# Patient Record
Sex: Male | Born: 1955
Health system: Southern US, Community
[De-identification: ages and names within clinical notes are randomized; demographics above are authoritative.]

## PROBLEM LIST (undated history)

## (undated) DIAGNOSIS — H3320 Serous retinal detachment, unspecified eye: Secondary | ICD-10-CM

## (undated) DIAGNOSIS — I10 Essential (primary) hypertension: Secondary | ICD-10-CM

## (undated) HISTORY — PX: KNEE SURGERY: SHX244

---

## 2010-12-19 ENCOUNTER — Emergency Department (HOSPITAL_BASED_OUTPATIENT_CLINIC_OR_DEPARTMENT_OTHER)
Admission: EM | Admit: 2010-12-19 | Discharge: 2010-12-19 | Payer: Self-pay | Source: Home / Self Care | Admitting: Emergency Medicine

## 2011-11-19 ENCOUNTER — Encounter: Payer: Self-pay | Admitting: *Deleted

## 2011-11-19 ENCOUNTER — Emergency Department (HOSPITAL_BASED_OUTPATIENT_CLINIC_OR_DEPARTMENT_OTHER)
Admission: EM | Admit: 2011-11-19 | Discharge: 2011-11-19 | Disposition: A | Discharge status: Home / Self Care | Payer: Federal, State, Local not specified - PPO | Attending: Emergency Medicine | Admitting: Emergency Medicine

## 2011-11-19 ENCOUNTER — Emergency Department (INDEPENDENT_AMBULATORY_CARE_PROVIDER_SITE_OTHER): Payer: Federal, State, Local not specified - PPO

## 2011-11-19 ENCOUNTER — Other Ambulatory Visit: Payer: Self-pay

## 2011-11-19 DIAGNOSIS — K219 Gastro-esophageal reflux disease without esophagitis: Secondary | ICD-10-CM

## 2011-11-19 DIAGNOSIS — I10 Essential (primary) hypertension: Secondary | ICD-10-CM | POA: Insufficient documentation

## 2011-11-19 DIAGNOSIS — R079 Chest pain, unspecified: Secondary | ICD-10-CM

## 2011-11-19 HISTORY — DX: Serous retinal detachment, unspecified eye: H33.20

## 2011-11-19 HISTORY — DX: Essential (primary) hypertension: I10

## 2011-11-19 LAB — CARDIAC PANEL(CRET KIN+CKTOT+MB+TROPI)
CK, MB: 3.3 ng/mL (ref 0.3–4.0)
Relative Index: 0.7 (ref 0.0–2.5)

## 2011-11-19 LAB — BASIC METABOLIC PANEL
CO2: 25 mEq/L (ref 19–32)
Calcium: 9.4 mg/dL (ref 8.4–10.5)
Chloride: 104 mEq/L (ref 96–112)
Creatinine, Ser: 1 mg/dL (ref 0.50–1.35)
Glucose, Bld: 95 mg/dL (ref 70–99)

## 2011-11-19 MED ORDER — FAMOTIDINE 20 MG PO TABS
40.0000 mg | ORAL_TABLET | Freq: Once | ORAL | Status: AC
  Administered 2011-11-19: 40 mg via ORAL
  Filled 2011-11-19: qty 2

## 2011-11-19 MED ORDER — GI COCKTAIL ~~LOC~~
30.0000 mL | Freq: Once | ORAL | Status: AC
  Administered 2011-11-19: 30 mL via ORAL
  Filled 2011-11-19: qty 30

## 2011-11-19 MED ORDER — SUCRALFATE 1 G PO TABS
ORAL_TABLET | ORAL | Status: AC
  Filled 2011-11-19: qty 1

## 2011-11-19 MED ORDER — PANTOPRAZOLE SODIUM 20 MG PO TBEC: 40.0000 mg | DELAYED_RELEASE_TABLET | Freq: Every day | ORAL | Status: AC

## 2011-11-19 MED ORDER — SUCRALFATE 1 G PO TABS
1.0000 g | ORAL_TABLET | Freq: Three times a day (TID) | ORAL | Status: DC
  Administered 2011-11-19: 1 g via ORAL

## 2011-11-19 MED ORDER — SUCRALFATE 1 G PO TABS: 1.0000 g | ORAL_TABLET | Freq: Four times a day (QID) | ORAL | Status: DC

## 2011-11-19 NOTE — ED Provider Notes (Signed)
History    This chart was scribed for Kyle Baker, MD, MD by Smitty Pluck. The patient was seen in room MH10 and the patient's care was started at 7:39PM.   CSN: [id number] Arrival date & time: 11/19/2011  7:15 PM   First MD Initiated Contact with Patient 11/19/11 1926      Chief Complaint  Patient presents with  . Chest Pain    (Consider location/radiation/quality/duration/timing/severity/associated sxs/prior treatment) Patient is a 55 y.o. male presenting with chest pain. The history is provided by the patient.  Chest Pain    Kyle Chaney is a 55 y.o. male who presents to the Emergency Department complaining of persistent moderate chest pain that radiates into shoulders and neck onset today. Also, he has been having frequent bowel movements that are looser than normal. Pt reports that the pain is relieved with burping. He states he has had flatulence. Denies nausea, dyspnea, exertional chest pain, diaphoresis, vomiting, constipation, and blood in stool. Took Gas X medication without relief. Pt reports drinking alcohol this weekend. His chest discomfort is described as pressure and has been constant all day. Pt reports having HTN and detached retina.  Past Medical History  Diagnosis Date  . Hypertension   . Detached retina     Past Surgical History  Procedure Date  . Knee surgery     History reviewed. No pertinent family history.  History  Substance Use Topics  . Smoking status: Never Smoker   . Smokeless tobacco: Not on file  . Alcohol Use: Yes      Review of Systems  Cardiovascular: Positive for chest pain.  All other systems reviewed and are negative.   10 Systems reviewed and are negative for acute change except as noted in the HPI.  Allergies  Review of patient's allergies indicates no known allergies.  Home Medications   Current Outpatient Rx  Name Route Sig Dispense Refill  . LISINOPRIL 20 MG PO TABS Oral Take 20 mg by mouth daily.        BP  161/91  Pulse 92  Temp(Src) 98.6 F (37 C) (Oral)  Resp 20  Ht 6' (1.829 m)  Wt 185 lb (83.915 kg)  BMI 25.09 kg/m2  SpO2 98%  Physical Exam  Nursing note and vitals reviewed. Constitutional: He is oriented to person, place, and time. He appears well-developed and well-nourished. No distress.  HENT:  Head: Normocephalic and atraumatic.  Eyes: EOM are normal. Pupils are equal, round, and reactive to light.  Neck: Normal range of motion. Neck supple. No tracheal deviation present.  Cardiovascular: Normal rate, regular rhythm and normal heart sounds.   Pulmonary/Chest: Effort normal and breath sounds normal. No respiratory distress.  Abdominal: Soft. Bowel sounds are normal. He exhibits no distension. There is no tenderness.  Musculoskeletal: Normal range of motion.  Neurological: He is alert and oriented to person, place, and time.  Skin: Skin is warm and dry.  Psychiatric: He has a normal mood and affect. His behavior is normal.    ED Course  Procedures (including critical care time)  DIAGNOSTIC STUDIES: Oxygen Saturation is 98% on room air, normal by my interpretation.    COORDINATION OF CARE:    Labs Reviewed  BASIC METABOLIC PANEL - Abnormal; Notable for the following:    GFR calc non Af Amer 83 (*)    All other components within normal limits  CARDIAC PANEL(CRET KIN+CKTOT+MB+TROPI) - Abnormal; Notable for the following:    Total CK 463 (*)    All  other components within normal limits   Dg Chest 2 View  11/19/2011  *RADIOLOGY REPORT*  Clinical Data: Chest pain.  CHEST - 2 VIEW  Comparison: Chest x-ray 12/19/2010.  Findings: The cardiac silhouette, mediastinal and hilar contours are within normal limits and stable. The lungs are clear.  No pleural effusions.  The bony thorax is intact.  IMPRESSION: Normal chest x-ray.  No change since prior study.  Original Report Authenticated By: P. Loralie Champagne, M.D.     No diagnosis found.    MDM   Date: 11/19/2011   Rate: 85  Rhythm: normal sinus rhythm  QRS Axis: left  Intervals: normal  ST/T Wave abnormalities: nonspecific ST/T changes  Conduction Disutrbances:none  Narrative Interpretation:   Old EKG Reviewed: none available        I personally performed the services described in this documentation, which was scribed in my presence. The recorded information has been reviewed and considered.  8:54 PM Pt given meds for gerd and feels better, no concern for acs ( cardiac enzymes neg)--will d/c  Kyle Baker, MD 11/19/11 2055

## 2011-11-19 NOTE — ED Notes (Signed)
Pt states he has had chest tightness today. "Burping a lot, passing more gas than usual and having looser than normal stools" Radiates up into shoulders and neck. Denies other s/s.

## 2017-09-01 ENCOUNTER — Emergency Department (HOSPITAL_BASED_OUTPATIENT_CLINIC_OR_DEPARTMENT_OTHER)
Admission: EM | Admit: 2017-09-01 | Discharge: 2017-09-01 | Disposition: A | Discharge status: Home / Self Care | Payer: Federal, State, Local not specified - PPO | Attending: Emergency Medicine | Admitting: Emergency Medicine

## 2017-09-01 ENCOUNTER — Encounter (HOSPITAL_BASED_OUTPATIENT_CLINIC_OR_DEPARTMENT_OTHER): Payer: Self-pay | Admitting: Emergency Medicine

## 2017-09-01 ENCOUNTER — Emergency Department (HOSPITAL_BASED_OUTPATIENT_CLINIC_OR_DEPARTMENT_OTHER): Payer: Federal, State, Local not specified - PPO

## 2017-09-01 DIAGNOSIS — Y999 Unspecified external cause status: Secondary | ICD-10-CM | POA: Diagnosis not present

## 2017-09-01 DIAGNOSIS — I10 Essential (primary) hypertension: Secondary | ICD-10-CM | POA: Diagnosis not present

## 2017-09-01 DIAGNOSIS — Z79899 Other long term (current) drug therapy: Secondary | ICD-10-CM | POA: Diagnosis not present

## 2017-09-01 DIAGNOSIS — S61215A Laceration without foreign body of left ring finger without damage to nail, initial encounter: Secondary | ICD-10-CM | POA: Insufficient documentation

## 2017-09-01 DIAGNOSIS — S6982XA Other specified injuries of left wrist, hand and finger(s), initial encounter: Secondary | ICD-10-CM | POA: Diagnosis present

## 2017-09-01 DIAGNOSIS — Z23 Encounter for immunization: Secondary | ICD-10-CM | POA: Diagnosis not present

## 2017-09-01 DIAGNOSIS — Y939 Activity, unspecified: Secondary | ICD-10-CM | POA: Insufficient documentation

## 2017-09-01 DIAGNOSIS — Y929 Unspecified place or not applicable: Secondary | ICD-10-CM | POA: Insufficient documentation

## 2017-09-01 DIAGNOSIS — W268XXA Contact with other sharp object(s), not elsewhere classified, initial encounter: Secondary | ICD-10-CM | POA: Diagnosis not present

## 2017-09-01 MED ORDER — AMOXICILLIN-POT CLAVULANATE 875-125 MG PO TABS: 1.0000 | ORAL_TABLET | Freq: Two times a day (BID) | ORAL | 0 refills | Status: DC

## 2017-09-01 MED ORDER — LIDOCAINE HCL 2 % IJ SOLN
INTRAMUSCULAR | Status: AC
  Administered 2017-09-01: 400 mg
  Filled 2017-09-01: qty 20

## 2017-09-01 MED ORDER — IBUPROFEN 800 MG PO TABS: 800.0000 mg | ORAL_TABLET | Freq: Three times a day (TID) | ORAL | 0 refills | Status: AC

## 2017-09-01 MED ORDER — LIDOCAINE HCL (PF) 1 % IJ SOLN: 10.0000 mL | Freq: Once | INTRAMUSCULAR | Status: DC

## 2017-09-01 MED ORDER — TETANUS-DIPHTH-ACELL PERTUSSIS 5-2.5-18.5 LF-MCG/0.5 IM SUSP
0.5000 mL | Freq: Once | INTRAMUSCULAR | Status: AC
  Administered 2017-09-01: 0.5 mL via INTRAMUSCULAR
  Filled 2017-09-01: qty 0.5

## 2017-09-01 NOTE — Discharge Instructions (Signed)
Keep dressing on for 24 hours. After this, you may remove dressing, wash gently with soap and water, and reapply dressing. Change dressing daily. Take antibiotics as prescribed. Take ibuprofen 800 mg 3 times a day with meals. Do not take other antibiotics at the same time (Advil, Motrin, naproxen, Aleve). You may supplement as needed with Tylenol. Follow-up in 7 - 10 days for suture removal. Return earlier if you develop fever, chills, purulent drainage, inability to move your finger, or any new or worsening symptoms.

## 2017-09-01 NOTE — ED Notes (Signed)
ED Provider at bedside. 

## 2017-09-01 NOTE — ED Notes (Signed)
Assumed care of patient from [place], [place]. Pt resting quietly. No distress. PA at bedside. Denies pain at present.

## 2017-09-01 NOTE — ED Provider Notes (Signed)
[facility] Provider [facility]   CSN: [id number] Arrival date & time: 09/01/17  1142     History   Chief Complaint Chief Complaint  Patient presents with  . Laceration    HPI Kyle Chaney is a 61 y.o. male presenting with injury of the left ring finger.  Patient states he was wearing gloves, lifting a heavy metal cage. He felt something abnormal with his finger, took off his glove, and the pad of his ring finger was avulsed. There was no damage to the glove. He has no injury to other fingers. He denies numbness or tingling. He has full range of motion of the finger. He reports pain at the site of avulsion, but no pain elsewhere. He has not taken anything for pain. He does not know when his last tetanus shot was. He is not on blood thinners. He is not immunocompromised. He has a history of high blood pressure, for which he takes medication. He has not taken his medication today, and states he was very anxious due to his injury. He denies headache, slurred speech, vision changes, weakness, or numbness.  HPI  Past Medical History:  Diagnosis Date  . Detached retina   . Hypertension     There are no active problems to display for this patient.   Past Surgical History:  Procedure Laterality Date  . KNEE SURGERY         Home Medications    Prior to Admission medications   Medication Sig Start Date End Date Taking? Authorizing Provider  amoxicillin-clavulanate (AUGMENTIN) 875-125 MG tablet Take 1 tablet by mouth every 12 (twelve) hours. 09/01/17   Jad Johansson, PA-C  ibuprofen (ADVIL,MOTRIN) 800 MG tablet Take 1 tablet (800 mg total) by mouth 3 (three) times daily with meals. 09/01/17   Cynitha Berte, PA-C  lisinopril (PRINIVIL,ZESTRIL) 20 MG tablet Take 20 mg by mouth daily.      [provider]  pantoprazole (PROTONIX) 20 MG tablet Take 2 tablets (40 mg total) by mouth daily. 11/19/11 11/18/12  Lorre Nick, MD  simethicone (MYLICON) 125 MG  chewable tablet Chew 125 mg by mouth 2 (two) times daily as needed. For gas     [provider]  sucralfate (CARAFATE) 1 G tablet Take 1 tablet (1 g total) by mouth 4 (four) times daily. 11/19/11 11/18/12  Lorre Nick, MD    Family History No family history on file.  Social History Social History  Substance Use Topics  . Smoking status: Never Smoker  . Smokeless tobacco: Never Used  . Alcohol use Yes     Allergies   Patient has no known allergies.   Review of Systems Review of Systems  Skin: Positive for wound.  Allergic/Immunologic: Negative for immunocompromised state.  Neurological: Negative for numbness.  Hematological: Does not bruise/bleed easily.     Physical Exam Updated Vital Signs BP (!) 197/118 (BP Location: Right Arm) Comment: RN aware. Pt states is going to PCP in a week for BP  Pulse 76   Temp 98.5 F (36.9 C) (Oral)   Resp 20   Ht 6' (1.829 m)   Wt 81.6 kg (180 lb)   SpO2 97%   BMI 24.41 kg/m   Physical Exam  Constitutional: He is oriented to person, place, and time. He appears well-developed and well-nourished. No distress.  HENT:  Head: Normocephalic and atraumatic.  Eyes: EOM are normal.  Neck: Normal range of motion.  Pulmonary/Chest: Effort normal.  Abdominal: He exhibits no distension.  Musculoskeletal:  Normal range of motion.  Full active range of motion of all fingers. Sensation of the ring finger intact. Strength against resistance intact.  Neurological: He is alert and oriented to person, place, and time.  Skin: Skin is warm. No rash noted.  Avulsion of the pad of left ring finger. Minimal bleeding. No involvement of the nail. No injury elsewhere.  Psychiatric: He has a normal mood and affect.  Nursing note and vitals reviewed.    ED Treatments / Results  Labs (all labs ordered are listed, but only abnormal results are displayed) Labs Reviewed - No data to display  EKG  EKG Interpretation None        Radiology Dg Finger Ring Left  Result Date: 09/01/2017 CLINICAL DATA:  61 year old male with a history of crush injury EXAM: LEFT RING FINGER 2+V COMPARISON:  None. FINDINGS: Soft tissue defect of the volar soft tissues of the distal left fourth digit. No fracture line identified. Joints appear congruent. Minimal degenerative changes. No radiopaque foreign body. IMPRESSION: Negative for acute bony abnormality. Soft tissue defect on the volar soft tissues of the distal fourth digit, compatible with given history of trauma. Electronically Signed   By: Gilmer Mor D.O.   On: 09/01/2017 12:46    Procedures .Marland KitchenLaceration Repair Date/Time: 09/01/2017 2:09 PM Performed by: Alveria Apley Authorized by: Alveria Apley   Consent:    Consent obtained:  Verbal   Consent given by:  Patient   Risks discussed:  Infection, pain, poor cosmetic result, poor wound healing, vascular damage, tendon damage and nerve damage Anesthesia (see MAR for exact dosages):    Anesthesia method:  Local infiltration   Local anesthetic:  Lidocaine 2% w/o epi Laceration details:    Location:  Finger   Finger location:  L ring finger   Length (cm):  1.5 (Circular)   Depth (mm):  3 Repair type:    Repair type:  Simple Pre-procedure details:    Preparation:  Patient was prepped and draped in usual sterile fashion Exploration:    Hemostasis achieved with:  Direct pressure   Wound exploration: wound explored through full range of motion and entire depth of wound probed and visualized     Wound exploration comment:  Laceration explored to its full extent in a nonbloody field Treatment:    Area cleansed with:  Saline and Betadine   Amount of cleaning:  Extensive   Irrigation solution:  Sterile saline   Irrigation volume:  500   Irrigation method:  Syringe Skin repair:    Repair method:  Sutures   Suture size:  5-0   Suture material:  Prolene   Suture technique:  Simple interrupted   Number of sutures:   7 Approximation:    Approximation:  Close Post-procedure details:    Dressing:  Bulky dressing   Patient tolerance of procedure:  Tolerated well, no immediate complications    (including critical care time)  Medications Ordered in ED Medications  lidocaine (PF) (XYLOCAINE) 1 % injection 10 mL (not administered)  Tdap (BOOSTRIX) injection 0.5 mL (0.5 mLs Intramuscular Given 09/01/17 1315)  lidocaine (XYLOCAINE) 2 % (with pres) injection (400 mg  Given 09/01/17 1320)     Initial Impression / Assessment and Plan / ED Course  I have reviewed the triage vital signs and the nursing notes.  Pertinent labs & imaging results that were available during my care of the patient were reviewed by me and considered in my medical decision making (see chart for details).  Patient presenting with avulsion of the pad of his left ring finger. Neurovascularly intact. No apparent tendon or nerve damage. X-ray negative for fracture. Digital block performed, and wound was irrigated extensively. Sutures place, and aftercare instructions given. Abx given to prevent infection. Patient was able to move finger with full range of motion after suturing. Patient to follow-up in 7-10 days for suture removal. Return precautions given. Patient states he understands and agrees to plan.  Final Clinical Impressions(s) / ED Diagnoses   Final diagnoses:  Laceration of left ring finger without foreign body without damage to nail, initial encounter    New Prescriptions Discharge Medication List as of 09/01/2017  2:07 PM    START taking these medications   Details  amoxicillin-clavulanate (AUGMENTIN) 875-125 MG tablet Take 1 tablet by mouth every 12 (twelve) hours., Starting Sat 09/01/2017, Print         [place], [place], PA-C 09/01/17 1646    Rolan Bucco, MD 09/02/17 [phone]

## 2017-09-01 NOTE — ED Triage Notes (Signed)
Laceration to L ring finger from a metal cage.

## 2017-12-03 ENCOUNTER — Emergency Department (HOSPITAL_BASED_OUTPATIENT_CLINIC_OR_DEPARTMENT_OTHER)
Admission: EM | Admit: 2017-12-03 | Discharge: 2017-12-03 | Disposition: A | Discharge status: Home / Self Care | Payer: Federal, State, Local not specified - PPO | Attending: Emergency Medicine | Admitting: Emergency Medicine

## 2017-12-03 ENCOUNTER — Encounter (HOSPITAL_BASED_OUTPATIENT_CLINIC_OR_DEPARTMENT_OTHER): Payer: Self-pay | Admitting: *Deleted

## 2017-12-03 ENCOUNTER — Other Ambulatory Visit: Payer: Self-pay

## 2017-12-03 ENCOUNTER — Emergency Department (HOSPITAL_BASED_OUTPATIENT_CLINIC_OR_DEPARTMENT_OTHER): Payer: Federal, State, Local not specified - PPO

## 2017-12-03 DIAGNOSIS — F17200 Nicotine dependence, unspecified, uncomplicated: Secondary | ICD-10-CM | POA: Diagnosis not present

## 2017-12-03 DIAGNOSIS — I10 Essential (primary) hypertension: Secondary | ICD-10-CM | POA: Diagnosis not present

## 2017-12-03 DIAGNOSIS — Z79899 Other long term (current) drug therapy: Secondary | ICD-10-CM | POA: Diagnosis not present

## 2017-12-03 DIAGNOSIS — R05 Cough: Secondary | ICD-10-CM | POA: Diagnosis present

## 2017-12-03 DIAGNOSIS — J189 Pneumonia, unspecified organism: Secondary | ICD-10-CM | POA: Insufficient documentation

## 2017-12-03 LAB — BASIC METABOLIC PANEL
Anion gap: 6 (ref 5–15)
BUN: 14 mg/dL (ref 6–20)
CO2: 24 mmol/L (ref 22–32)
Calcium: 9.2 mg/dL (ref 8.9–10.3)
Chloride: 104 mmol/L (ref 101–111)
Creatinine, Ser: 1.03 mg/dL (ref 0.61–1.24)
GFR calc Af Amer: >60 mL/min (ref >60)
GFR calc non Af Amer: >60 mL/min (ref >60)
Glucose, Bld: 89 mg/dL (ref 65–99)
Potassium: 4 mmol/L (ref 3.5–5.1)
Sodium: 134 mmol/L — ABNORMAL LOW (ref 135–145)

## 2017-12-03 LAB — CBC WITH DIFFERENTIAL/PLATELET
Basophils Absolute: 0 10*3/uL (ref 0.0–0.1)
Basophils Relative: 1 %
Eosinophils Absolute: 0.2 10*3/uL (ref 0.0–0.7)
Eosinophils Relative: 3 %
HCT: 41.5 % (ref 39.0–52.0)
Hemoglobin: 14 g/dL (ref 13.0–17.0)
Lymphocytes Relative: 34 %
Lymphs Abs: 2.1 10*3/uL (ref 0.7–4.0)
MCH: 32.7 pg (ref 26.0–34.0)
MCHC: 33.7 g/dL (ref 30.0–36.0)
MCV: 97 fL (ref 78.0–100.0)
Monocytes Absolute: 0.7 10*3/uL (ref 0.1–1.0)
Monocytes Relative: 11 %
Neutro Abs: 3.3 10*3/uL (ref 1.7–7.7)
Neutrophils Relative %: 53 %
Platelets: 243 10*3/uL (ref 150–400)
RBC: 4.28 MIL/uL (ref 4.22–5.81)
RDW: 13 % (ref 11.5–15.5)
WBC: 6.2 10*3/uL (ref 4.0–10.5)

## 2017-12-03 LAB — TROPONIN I: Troponin I: <0.03 ng/mL (ref <0.03)

## 2017-12-03 MED ORDER — ALBUTEROL SULFATE HFA 108 (90 BASE) MCG/ACT IN AERS
2.0000 | INHALATION_SPRAY | Freq: Once | RESPIRATORY_TRACT | Status: AC
Start: 2017-12-03 — End: 2017-12-03
  Administered 2017-12-03: 2 via RESPIRATORY_TRACT
  Filled 2017-12-03: qty 6.7

## 2017-12-03 MED ORDER — AZITHROMYCIN 250 MG PO TABS
500.0000 mg | ORAL_TABLET | Freq: Once | ORAL | Status: AC
  Administered 2017-12-03: 500 mg via ORAL
  Filled 2017-12-03: qty 2

## 2017-12-03 MED ORDER — AZITHROMYCIN 250 MG PO TABS: 250.0000 mg | ORAL_TABLET | Freq: Every day | ORAL | 0 refills | Status: DC

## 2017-12-03 MED ORDER — CEFTRIAXONE SODIUM 2 G IJ SOLR
2.0000 g | Freq: Once | INTRAMUSCULAR | Status: AC
  Administered 2017-12-03: 2 g via INTRAVENOUS
  Filled 2017-12-03: qty 2

## 2017-12-03 MED ORDER — AMOXICILLIN 500 MG PO CAPS: 500.0000 mg | ORAL_CAPSULE | Freq: Three times a day (TID) | ORAL | 0 refills | Status: DC

## 2017-12-03 MED ORDER — AEROCHAMBER PLUS FLO-VU MEDIUM MISC
1.0000 | Freq: Once | Status: AC
  Administered 2017-12-03: 1
  Filled 2017-12-03: qty 1

## 2017-12-03 NOTE — ED Triage Notes (Signed)
Pt reports cough x 1 week, initially producing clear sputum, subjective temps, sputum changed color to dark yellow a few days ago. Pt reports constant pain "like gas" to his chest x 3 days, relieved with belching. ekg performed during triage and shown to edp.

## 2017-12-03 NOTE — ED Notes (Signed)
Pt transported to and from XR 

## 2017-12-05 NOTE — ED Provider Notes (Signed)
[facility] EMERGENCY DEPARTMENT Provider Note   CSN: [id number] Arrival date & time: 12/03/17  1111     History   Chief Complaint Chief Complaint  Patient presents with  . Cough    HPI Kyle Chaney is a 62 y.o. male.  HPI   62 year old male with cough and chest pain.  The cough began approximately 1 week ago.  Reports yellow sputum.  No hemoptysis.  The past 3 days she has had vague chest pain.  Says it feels "like gas left side of his chest.  No appreciable exacerbating relieving factors.  Denies any other associated symptoms such as diaphoresis or nausea.  No unusual leg pain or swelling.  Past Medical History:  Diagnosis Date  . Detached retina   . Hypertension     There are no active problems to display for this patient.   Past Surgical History:  Procedure Laterality Date  . KNEE SURGERY         Home Medications    Prior to Admission medications   Medication Sig Start Date End Date Taking? Authorizing Provider  amoxicillin (AMOXIL) 500 MG capsule Take 1 capsule (500 mg total) by mouth 3 (three) times daily. 12/03/17   Raeford RazorKohut, Kalaya Infantino, MD  amoxicillin-clavulanate (AUGMENTIN) 875-125 MG tablet Take 1 tablet by mouth every 12 (twelve) hours. 09/01/17   Caccavale, Sophia, PA-C  azithromycin (ZITHROMAX Z-PAK) 250 MG tablet Take 1 tablet (250 mg total) by mouth daily. First dose in ER. Start 1 tablet daily on 12/04/17 for 4 days. 12/03/17   Raeford RazorKohut, Tamaya Pun, MD  ibuprofen (ADVIL,MOTRIN) 800 MG tablet Take 1 tablet (800 mg total) by mouth 3 (three) times daily with meals. 09/01/17   Caccavale, Sophia, PA-C  lisinopril (PRINIVIL,ZESTRIL) 20 MG tablet Take 20 mg by mouth daily.      [provider]  pantoprazole (PROTONIX) 20 MG tablet Take 2 tablets (40 mg total) by mouth daily. 11/19/11 11/18/12  Lorre NickAllen, Anthony, MD  simethicone (MYLICON) 125 MG chewable tablet Chew 125 mg by mouth 2 (two) times daily as needed. For gas     [provider]    sucralfate (CARAFATE) 1 G tablet Take 1 tablet (1 g total) by mouth 4 (four) times daily. 11/19/11 11/18/12  Lorre NickAllen, Anthony, MD    Family History History reviewed. No pertinent family history.  Social History Social History   Tobacco Use  . Smoking status: Current Every Day Smoker  . Smokeless tobacco: Never Used  Substance Use Topics  . Alcohol use: Yes  . Drug use: No     Allergies   Patient has no known allergies.   Review of Systems Review of Systems  All systems reviewed and negative, other than as noted in HPI.  Physical Exam Updated Vital Signs BP (!) 170/100 (BP Location: Right Arm)   Pulse 73   Temp 99.2 F (37.3 C)   Resp 16   Ht 6' (1.829 m)   Wt 79.4 kg (175 lb)   SpO2 98%   BMI 23.73 kg/m   Physical Exam  Constitutional: He appears well-developed and well-nourished. No distress.  HENT:  Head: Normocephalic and atraumatic.  Eyes: Conjunctivae are normal. Right eye exhibits no discharge. Left eye exhibits no discharge.  Neck: Neck supple.  Cardiovascular: Normal rate, regular rhythm and normal heart sounds. Exam reveals no gallop and no friction rub.  No murmur heard. Pulmonary/Chest: Effort normal and breath sounds normal. No respiratory distress.  Abdominal: Soft. He exhibits no distension. There is no  tenderness.  Musculoskeletal: He exhibits no edema or tenderness.  Lower extremities symmetric as compared to each other. No calf tenderness. Negative Homan's. No palpable cords.   Neurological: He is alert.  Skin: Skin is warm and dry.  Psychiatric: He has a normal mood and affect. His behavior is normal. Thought content normal.  Nursing note and vitals reviewed.    ED Treatments / Results  Labs (all labs ordered are listed, but only abnormal results are displayed) Labs Reviewed  BASIC METABOLIC PANEL - Abnormal; Notable for the following components:      Result Value   Sodium 134 (*)    All other components within normal limits   TROPONIN I  CBC WITH DIFFERENTIAL/PLATELET    EKG  EKG Interpretation  Date/Time:  Monday December 03 2017 11:25:29 EST Ventricular Rate:  79 PR Interval:  138 QRS Duration: 84 QT Interval:  354 QTC Calculation: 405 R Axis:   -115 Text Interpretation:  Normal sinus rhythm Right superior axis deviation Anteroseptal infarct , age undetermined Abnormal ECG Confirmed by Raeford Razor [phone]) on 12/03/2017 11:51:28 AM       Radiology No results found.   Dg Chest 2 View  Result Date: 12/03/2017 CLINICAL DATA:  URI symptoms for the past 8 days. Two days of chest pain and shortness of breath. The patient is undergoing smoking sensation. EXAM: CHEST  2 VIEW COMPARISON:  Chest x-ray of December 19, 2016 FINDINGS: The lungs are mildly hyperinflated. There are linear increased densities projecting over the lower thoracic spine likely on the right. The heart and pulmonary vascularity are normal. The mediastinum is normal in width. There is faint calcification in the wall of the aortic arch. There is no pleural effusion. The bony thorax is unremarkable. IMPRESSION: COPD. Posterior right lower lobe atelectasis or pneumonia. Followup PA and lateral chest X-ray is recommended in 3-4 weeks following trial of antibiotic therapy to ensure resolution and exclude underlying malignancy. Thoracic aortic atherosclerosis. Electronically Signed   By: David  [place] M.D.   On: 12/03/2017 12:16    Procedures Procedures (including critical care time)  Medications Ordered in ED Medications  albuterol (PROVENTIL HFA;VENTOLIN HFA) 108 (90 Base) MCG/ACT inhaler 2 puff (2 puffs Inhalation Given 12/03/17 1238)  AEROCHAMBER PLUS FLO-VU MEDIUM MISC 1 each (1 each Other Given 12/03/17 1239)  cefTRIAXone (ROCEPHIN) 2 g in dextrose 5 % 50 mL IVPB (0 g Intravenous Stopped 12/03/17 1315)  azithromycin (ZITHROMAX) tablet 500 mg (500 mg Oral Given 12/03/17 1244)     Initial Impression / Assessment and Plan / ED Course   I have reviewed the triage vital signs and the nursing notes.  Pertinent labs & imaging results that were available during my care of the patient were reviewed by me and considered in my medical decision making (see chart for details).     62 year old male with cough and vague chest discomfort.  Chest x-ray with left-sided infiltrate.  He does not have significant increased work of breathing.  Oxygen saturations are normal on room air.  He will be treated as an outpatient for community-acquired pneumonia.  Return precautions were discussed.  Final Clinical Impressions(s) / ED Diagnoses   Final diagnoses:  Community acquired pneumonia, unspecified laterality    ED Discharge Orders        Ordered    azithromycin (ZITHROMAX Z-PAK) 250 MG tablet  Daily     12/03/17 1321    amoxicillin (AMOXIL) 500 MG capsule  3 times daily     12/03/17  1321       Raeford Razor, MD 12/05/17 1626

## 2018-03-19 ENCOUNTER — Other Ambulatory Visit: Payer: Self-pay

## 2018-03-19 ENCOUNTER — Encounter (HOSPITAL_BASED_OUTPATIENT_CLINIC_OR_DEPARTMENT_OTHER): Payer: Self-pay | Admitting: *Deleted

## 2018-03-19 ENCOUNTER — Emergency Department (HOSPITAL_BASED_OUTPATIENT_CLINIC_OR_DEPARTMENT_OTHER)
Admission: EM | Admit: 2018-03-19 | Discharge: 2018-03-19 | Disposition: A | Discharge status: Home / Self Care | Payer: Federal, State, Local not specified - PPO | Attending: Emergency Medicine | Admitting: Emergency Medicine

## 2018-03-19 DIAGNOSIS — F17200 Nicotine dependence, unspecified, uncomplicated: Secondary | ICD-10-CM | POA: Diagnosis not present

## 2018-03-19 DIAGNOSIS — J0110 Acute frontal sinusitis, unspecified: Secondary | ICD-10-CM | POA: Insufficient documentation

## 2018-03-19 DIAGNOSIS — Z79899 Other long term (current) drug therapy: Secondary | ICD-10-CM | POA: Insufficient documentation

## 2018-03-19 DIAGNOSIS — I10 Essential (primary) hypertension: Secondary | ICD-10-CM | POA: Insufficient documentation

## 2018-03-19 DIAGNOSIS — R0981 Nasal congestion: Secondary | ICD-10-CM | POA: Diagnosis present

## 2018-03-19 MED ORDER — CETIRIZINE HCL 10 MG PO TABS: 10.0000 mg | ORAL_TABLET | Freq: Every day | ORAL | 0 refills | Status: AC

## 2018-03-19 MED ORDER — FLUTICASONE PROPIONATE 50 MCG/ACT NA SUSP: 2.0000 | Freq: Every day | NASAL | 0 refills | Status: AC

## 2018-03-19 MED ORDER — AMOXICILLIN-POT CLAVULANATE 875-125 MG PO TABS: 1.0000 | ORAL_TABLET | Freq: Two times a day (BID) | ORAL | 0 refills | Status: AC

## 2018-03-19 MED FILL — FLUTICASONE PROP 50 MCG SPR: 50 | 30 days supply | Qty: 16 | Fill #0

## 2018-03-19 MED FILL — CETIRIZINE HCL 10 MG TABLET: 10 | 100 days supply | Qty: 100 | Fill #0

## 2018-03-19 MED FILL — AMOX-CLAV 875-125 MG TABLET: 875-125 | 7 days supply | Qty: 14 | Fill #0

## 2018-03-19 NOTE — Discharge Instructions (Signed)
Medications: Flonase, Zyrtec, Augmentin  Treatment: Take Flonase once daily as prescribed.  Take Zyrtec instead of Benadryl once daily.  Take Augmentin twice daily until completed.  You can find a Neti-pot over-the-counter to help with your symptoms.  Follow-up: Please follow-up with your doctor if your symptoms are not improving over the next week.  Please return to the emergency department if you develop any new or worsening symptoms.

## 2018-03-19 NOTE — ED Provider Notes (Signed)
[facility] EMERGENCY DEPARTMENT Provider Note   CSN: [id number] Arrival date & time: 03/19/18  1509     History   Chief Complaint Chief Complaint  Patient presents with  . Sinus Problem    HPI Kyle Chaney is a 62 y.o. male with history of hypertension who presents with a 6-day history of sinus congestion and frontal sinus pain.  He reports significant sinus pain.  He reports that the pollen has caused his symptoms.  He denies any fever, cough, ear pain, sore throat, chest pain, shortness of breath.  He has been taking Benadryl and Tylenol at home without relief.  He reports having sinus infections yearly or every 2 years.  HPI  Past Medical History:  Diagnosis Date  . Detached retina   . Hypertension     There are no active problems to display for this patient.   Past Surgical History:  Procedure Laterality Date  . KNEE SURGERY          Home Medications    Prior to Admission medications   Medication Sig Start Date End Date Taking? Authorizing Provider  amoxicillin-clavulanate (AUGMENTIN) 875-125 MG tablet Take 1 tablet by mouth every 12 (twelve) hours. 03/19/18   Altonio Schwertner, Waylan BogaAlexandra M, PA-C  cetirizine (ZYRTEC ALLERGY) 10 MG tablet Take 1 tablet (10 mg total) by mouth daily. 03/19/18   Essie Gehret, Waylan BogaAlexandra M, PA-C  fluticasone (FLONASE) 50 MCG/ACT nasal spray Place 2 sprays into both nostrils daily. 03/19/18   Eliana Lueth, Waylan BogaAlexandra M, PA-C  ibuprofen (ADVIL,MOTRIN) 800 MG tablet Take 1 tablet (800 mg total) by mouth 3 (three) times daily with meals. 09/01/17   Caccavale, Sophia, PA-C  lisinopril (PRINIVIL,ZESTRIL) 20 MG tablet Take 20 mg by mouth daily.      [provider]  pantoprazole (PROTONIX) 20 MG tablet Take 2 tablets (40 mg total) by mouth daily. 11/19/11 11/18/12  Lorre NickAllen, Anthony, MD    Family History History reviewed. No pertinent family history.  Social History Social History   Tobacco Use  . Smoking status: Current Every Day Smoker  . Smokeless  tobacco: Never Used  Substance Use Topics  . Alcohol use: Yes  . Drug use: No     Allergies   Patient has no known allergies.   Review of Systems Review of Systems  Constitutional: Negative for fever.  HENT: Positive for congestion, sinus pressure and sinus pain. Negative for ear pain and sore throat.   Respiratory: Negative for cough and shortness of breath.   Cardiovascular: Negative for chest pain.     Physical Exam Updated Vital Signs BP (!) 163/100 Comment: pt did not take his BP med today  Pulse 88   Temp 98.3 F (36.8 C) (Oral)   Resp 18   Ht 6' (1.829 m)   Wt 83.9 kg (185 lb)   SpO2 98%   BMI 25.09 kg/m   Physical Exam  Constitutional: He appears well-developed and well-nourished. No distress.  HENT:  Head: Normocephalic and atraumatic.    Nose: Mucosal edema present. Right sinus exhibits frontal sinus tenderness. Left sinus exhibits frontal sinus tenderness.  Mouth/Throat: Oropharynx is clear and moist. No oropharyngeal exudate.  Eyes: Pupils are equal, round, and reactive to light. Conjunctivae are normal. Right eye exhibits no discharge. Left eye exhibits no discharge. No scleral icterus.  Neck: Normal range of motion. Neck supple. No thyromegaly present.  Cardiovascular: Normal rate, regular rhythm, normal heart sounds and intact distal pulses. Exam reveals no gallop and no friction rub.  No  murmur heard. Pulmonary/Chest: Effort normal and breath sounds normal. No stridor. No respiratory distress. He has no wheezes. He has no rales.  Musculoskeletal: He exhibits no edema.  Lymphadenopathy:    He has no cervical adenopathy.  Neurological: He is alert. Coordination normal.  Skin: Skin is warm and dry. No rash noted. He is not diaphoretic. No pallor.  Psychiatric: He has a normal mood and affect.  Nursing note and vitals reviewed.    ED Treatments / Results  Labs (all labs ordered are listed, but only abnormal results are displayed) Labs Reviewed -  No data to display  EKG None  Radiology No results found.  Procedures Procedures (including critical care time)  Medications Ordered in ED Medications - No data to display   Initial Impression / Assessment and Plan / ED Course  I have reviewed the triage vital signs and the nursing notes.  Pertinent labs & imaging results that were available during my care of the patient were reviewed by me and considered in my medical decision making (see chart for details).     Patient withsinusitis.  Considering significant facial pain and tenderness, will treat with Augmentin.  Will discharge home with symptomatic treatment including Flonase and Zyrtec.  I also recommended nasal rinse.  Follow-up to PCP as needed.  Return precautions discussed.  Patient understands and agrees with plan.  Patient vitals stable and discharged in satisfactory condition.  Final Clinical Impressions(s) / ED Diagnoses   Final diagnoses:  Acute frontal sinusitis, recurrence not specified    ED Discharge Orders        Ordered    cetirizine (ZYRTEC ALLERGY) 10 MG tablet  Daily     03/19/18 1642    fluticasone (FLONASE) 50 MCG/ACT nasal spray  Daily     03/19/18 1642    amoxicillin-clavulanate (AUGMENTIN) 875-125 MG tablet  Every 12 hours     03/19/18 1642       Mckaila Duffus, [place], PA-C 03/19/18 1646    Arby Barrette, MD 03/19/18 1718

## 2018-03-19 NOTE — ED Triage Notes (Signed)
Pt c/o simus congestion x 4 days

## 2018-07-11 ENCOUNTER — Emergency Department (HOSPITAL_BASED_OUTPATIENT_CLINIC_OR_DEPARTMENT_OTHER): Payer: Federal, State, Local not specified - PPO

## 2018-07-11 ENCOUNTER — Other Ambulatory Visit: Payer: Self-pay

## 2018-07-11 ENCOUNTER — Encounter (HOSPITAL_BASED_OUTPATIENT_CLINIC_OR_DEPARTMENT_OTHER): Payer: Self-pay | Admitting: Emergency Medicine

## 2018-07-11 ENCOUNTER — Emergency Department (HOSPITAL_BASED_OUTPATIENT_CLINIC_OR_DEPARTMENT_OTHER)
Admission: EM | Admit: 2018-07-11 | Discharge: 2018-07-11 | Disposition: A | Discharge status: Home / Self Care | Payer: Federal, State, Local not specified - PPO | Attending: Emergency Medicine | Admitting: Emergency Medicine

## 2018-07-11 DIAGNOSIS — Z79899 Other long term (current) drug therapy: Secondary | ICD-10-CM | POA: Insufficient documentation

## 2018-07-11 DIAGNOSIS — K2970 Gastritis, unspecified, without bleeding: Secondary | ICD-10-CM | POA: Insufficient documentation

## 2018-07-11 DIAGNOSIS — R0789 Other chest pain: Secondary | ICD-10-CM | POA: Diagnosis not present

## 2018-07-11 DIAGNOSIS — F17200 Nicotine dependence, unspecified, uncomplicated: Secondary | ICD-10-CM | POA: Diagnosis not present

## 2018-07-11 DIAGNOSIS — I10 Essential (primary) hypertension: Secondary | ICD-10-CM | POA: Diagnosis not present

## 2018-07-11 DIAGNOSIS — R079 Chest pain, unspecified: Secondary | ICD-10-CM | POA: Diagnosis present

## 2018-07-11 LAB — BASIC METABOLIC PANEL
ANION GAP: 7 (ref 5–15)
BUN: 18 mg/dL (ref 8–23)
CO2: 26 mmol/L (ref 22–32)
Calcium: 9.1 mg/dL (ref 8.9–10.3)
Chloride: 106 mmol/L (ref 98–111)
Creatinine, Ser: 1.14 mg/dL (ref 0.61–1.24)
GFR calc Af Amer: >60 mL/min (ref >60)
Glucose, Bld: 106 mg/dL — ABNORMAL HIGH (ref 70–99)
POTASSIUM: 4 mmol/L (ref 3.5–5.1)
SODIUM: 139 mmol/L (ref 135–145)

## 2018-07-11 LAB — CBC
HEMATOCRIT: 41.5 % (ref 39.0–52.0)
HEMOGLOBIN: 14.3 g/dL (ref 13.0–17.0)
MCH: 34.2 pg — ABNORMAL HIGH (ref 26.0–34.0)
MCHC: 34.5 g/dL (ref 30.0–36.0)
MCV: 99.3 fL (ref 78.0–100.0)
Platelets: 204 10*3/uL (ref 150–400)
RBC: 4.18 MIL/uL — AB (ref 4.22–5.81)
RDW: 12.8 % (ref 11.5–15.5)
WBC: 5.9 10*3/uL (ref 4.0–10.5)

## 2018-07-11 LAB — TROPONIN I: Troponin I: <0.03 ng/mL (ref <0.03)

## 2018-07-11 LAB — D-DIMER, QUANTITATIVE (NOT AT ARMC): D DIMER QUANT: 0.36 ug{FEU}/mL (ref 0.00–0.50)

## 2018-07-11 MED ORDER — FAMOTIDINE IN NACL 20-0.9 MG/50ML-% IV SOLN
20.0000 mg | Freq: Once | INTRAVENOUS | Status: AC
  Administered 2018-07-11: 20 mg via INTRAVENOUS
  Filled 2018-07-11: qty 50

## 2018-07-11 MED ORDER — GI COCKTAIL ~~LOC~~
30.0000 mL | Freq: Once | ORAL | Status: AC
Start: 2018-07-11 — End: 2018-07-11
  Administered 2018-07-11: 30 mL via ORAL
  Filled 2018-07-11: qty 30

## 2018-07-11 MED ORDER — FAMOTIDINE 20 MG PO TABS: 20.0000 mg | ORAL_TABLET | Freq: Two times a day (BID) | ORAL | 0 refills | Status: AC

## 2018-07-11 MED ORDER — FAMOTIDINE 20 MG PO TABS: 20.0000 mg | ORAL_TABLET | Freq: Two times a day (BID) | ORAL | 0 refills | Status: DC

## 2018-07-11 NOTE — ED Notes (Signed)
ED Provider at bedside. 

## 2018-07-11 NOTE — Discharge Instructions (Signed)
Return to ED for worsening symptoms, increased chest pain or shortness of breath, vomiting or coughing up blood, lightheadedness or loss of consciousness.

## 2018-07-11 NOTE — ED Provider Notes (Signed)
[facility] EMERGENCY DEPARTMENT Provider Note   CSN: [id number] Arrival date & time: 07/11/18  1509     History   Chief Complaint Chief Complaint  Patient presents with  . Chest Pain    HPI Kyle Chaney is a 62 y.o. male with a past medical history of hypertension, who presents to ED for evaluation of central chest pressure, increased gas since yesterday.  States that he initially attributed the symptoms to drinking too much orange juice.  States that he drinks oranges multiple times a day which he knows causes him to be ascitic.  He usually "takes a few Tums and it goes away."  States that the pain worsened this morning when he woke up.  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  He has not taken any medicine help with the symptoms.  States that he stopped smoking tobacco products and drinking orange juice 4 days ago.  Any shortness of breath, hemoptysis, cough, fever.  He reports compliance with his home hypertension medication.  Denies any recent surgeries, recent prolonged travel.  Reports occasional alcohol and marijuana use.  Denies any other drug use.  HPI  Past Medical History:  Diagnosis Date  . Detached retina   . Hypertension     There are no active problems to display for this patient.   Past Surgical History:  Procedure Laterality Date  . KNEE SURGERY          Home Medications    Prior to Admission medications   Medication Sig Start Date End Date Taking? Authorizing Provider  amoxicillin-clavulanate (AUGMENTIN) 875-125 MG tablet Take 1 tablet by mouth every 12 (twelve) hours. 03/19/18   Law, Waylan Boga, PA-C  cetirizine (ZYRTEC ALLERGY) 10 MG tablet Take 1 tablet (10 mg total) by mouth daily. 03/19/18   Law, Waylan Boga, PA-C  famotidine (PEPCID) 20 MG tablet Take 1 tablet (20 mg total) by mouth 2 (two) times daily. 07/11/18   Maeley Matton, PA-C  fluticasone (FLONASE) 50 MCG/ACT nasal spray Place 2 sprays  into both nostrils daily. 03/19/18   Law, Waylan Boga, PA-C  ibuprofen (ADVIL,MOTRIN) 800 MG tablet Take 1 tablet (800 mg total) by mouth 3 (three) times daily with meals. 09/01/17   Caccavale, Sophia, PA-C  lisinopril (PRINIVIL,ZESTRIL) 20 MG tablet Take 20 mg by mouth daily.      [provider]  pantoprazole (PROTONIX) 20 MG tablet Take 2 tablets (40 mg total) by mouth daily. 11/19/11 11/18/12  Lorre Nick, MD    Family History No family history on file.  Social History Social History   Tobacco Use  . Smoking status: Current Every Day Smoker  . Smokeless tobacco: Never Used  Substance Use Topics  . Alcohol use: Yes    Comment: occ  . Drug use: No     Allergies   Patient has no known allergies.   Review of Systems Review of Systems  Constitutional: Negative for appetite change, chills and fever.  HENT: Negative for ear pain, rhinorrhea, sneezing and sore throat.   Eyes: Negative for photophobia and visual disturbance.  Respiratory: Negative for cough, chest tightness, shortness of breath and wheezing.   Cardiovascular: Positive for chest pain. Negative for palpitations.  Gastrointestinal: Negative for abdominal pain, blood in stool, constipation, diarrhea, nausea and vomiting.  Genitourinary: Negative for dysuria, hematuria and urgency.  Musculoskeletal: Negative for myalgias.  Skin: Negative for rash.  Neurological: Negative for dizziness, weakness and light-headedness.  Physical Exam Updated Vital Signs BP (!) 164/95 (BP Location: Left Arm)   Pulse 87   Temp 98.1 F (36.7 C) (Oral)   Resp 18   Ht 6' (1.829 m)   Wt 81.6 kg   SpO2 99%   BMI 24.41 kg/m   Physical Exam  Constitutional: He appears well-developed and well-nourished. No distress.  HENT:  Head: Normocephalic and atraumatic.  Nose: Nose normal.  Eyes: Conjunctivae and EOM are normal. Left eye exhibits no discharge. No scleral icterus.  Neck: Normal range of motion. Neck supple.    Cardiovascular: Normal rate, regular rhythm, normal heart sounds and intact distal pulses. Exam reveals no gallop and no friction rub.  No murmur heard. Pulmonary/Chest: Effort normal and breath sounds normal. No respiratory distress. He exhibits tenderness.    Abdominal: Soft. Bowel sounds are normal. He exhibits no distension. There is no tenderness. There is no guarding.  Musculoskeletal: Normal range of motion. He exhibits no edema.  No lower extremity edema, erythema or calf tenderness bilaterally.  Neurological: He is alert. He exhibits normal muscle tone. Coordination normal.  Skin: Skin is warm and dry. No rash noted.  Psychiatric: He has a normal mood and affect.  Nursing note and vitals reviewed.    ED Treatments / Results  Labs (all labs ordered are listed, but only abnormal results are displayed) Labs Reviewed  BASIC METABOLIC PANEL - Abnormal; Notable for the following components:      Result Value   Glucose, Bld 106 (*)    All other components within normal limits  CBC - Abnormal; Notable for the following components:   RBC 4.18 (*)    MCH 34.2 (*)    All other components within normal limits  TROPONIN I  D-DIMER, QUANTITATIVE (NOT AT [facility])    EKG EKG Interpretation  Date/Time:  Thursday July 11 2018 15:18:48 EDT Ventricular Rate:  80 PR Interval:  132 QRS Duration: 86 QT Interval:  360 QTC Calculation: 415 R Axis:   -70 Text Interpretation:  Sinus rhythm with occasional Premature ventricular complexes Left axis deviation Anteroseptal infarct , age undetermined Abnormal ECG No significant change since last tracing Confirmed by Jacalyn LefevreHaviland, Julie [phone]([id number]) on 07/11/2018 3:22:00 PM Also confirmed by Jacalyn LefevreHaviland, Julie [phone]), editor Josephine IgoBelcher, Jessica ([phone])  on 07/11/2018 3:33:53 PM   Radiology Dg Chest 2 View  Result Date: 07/11/2018 CLINICAL DATA:  Chest discomfort EXAM: CHEST - 2 VIEW COMPARISON:  December 03, 2017 FINDINGS: Lungs are mildly hyperexpanded. There  is no edema or consolidation. Heart size and pulmonary vascularity are normal. No adenopathy. No bone lesions. No pneumothorax. IMPRESSION: Lungs hyperexpanded without edema or consolidation. Stable cardiac silhouette. Electronically Signed   By: Bretta BangWilliam  Woodruff III M.D.   On: 07/11/2018 15:47    Procedures Procedures (including critical care time)  Medications Ordered in ED Medications  gi cocktail (Maalox,Lidocaine,Donnatal) (30 mLs Oral Given 07/11/18 1553)  famotidine (PEPCID) IVPB 20 mg premix (0 mg Intravenous Stopped 07/11/18 1629)     Initial Impression / Assessment and Plan / ED Course  I have reviewed the triage vital signs and the nursing notes.  Pertinent labs & imaging results that were available during my care of the patient were reviewed by me and considered in my medical decision making (see chart for details).     62 year old male with past medical history of hypertension presents to ED for evaluation of central chest pain, increasing gas and belching since yesterday.  States that he initially thought it was due  to drinking too much orange juice which she does daily.  He is also concerned that he may have pulled a muscle while doing some outdoor work yesterday.  He does admit to taking Tums sometimes to help with the ingestion due to his frequent orange juice.  Patient denies any history of MI, PE, recent surgeries, recent prolonged travel, hormone use or leg swelling.  He reports compliance with his home blood pressure medication.  On physical exam he is overall well-appearing.  There is some tenderness to palpation of the middle chest.  Lungs are clear to auscultation bilaterally.  He is not tachycardic, tachypneic or hypoxic.  EKG shows no changes from prior tracings, no ischemic changes.  Troponin, CBC, BMP, d-dimer negative and unremarkable.  Chest x-ray shows hyperinflation with no acute process.  Patient given GI cocktail, Pepcid with significant improvement in his symptoms.  Patient is low risk by HEART score and does not want to stay in ED for repeat troponin in 3 hours.  He is convinced that the symptoms are due to indigestion.  I feel that this is reasonable based on improvement with medications, lack of many risk factors and lab work.  Will discharge home with Pepcid and PCP follow-up.  Advised to return to ED for any severe worsening symptoms.  Portions of this note were generated with Scientist, clinical (histocompatibility and immunogenetics). Dictation errors may occur despite best attempts at proofreading.   Final Clinical Impressions(s) / ED Diagnoses   Final diagnoses:  Gastritis without bleeding, unspecified chronicity, unspecified gastritis type  Chest wall pain    ED Discharge Orders         Ordered    famotidine (PEPCID) 20 MG tablet  2 times daily,   Status:  Discontinued     07/11/18 1709    famotidine (PEPCID) 20 MG tablet  2 times daily     07/11/18 [id number], PA-C 07/11/18 1713    Jacalyn Lefevre, MD 07/11/18 1722

## 2018-07-11 NOTE — ED Triage Notes (Signed)
Chest pain for 3 days, states it feels like gas and that he drinks too much OJ. Denies SOB.

## 2019-08-26 ENCOUNTER — Emergency Department (HOSPITAL_BASED_OUTPATIENT_CLINIC_OR_DEPARTMENT_OTHER): Payer: Federal, State, Local not specified - PPO

## 2019-08-26 ENCOUNTER — Emergency Department (HOSPITAL_BASED_OUTPATIENT_CLINIC_OR_DEPARTMENT_OTHER)
Admission: EM | Admit: 2019-08-26 | Discharge: 2019-08-26 | Disposition: A | Discharge status: Home / Self Care | Payer: Federal, State, Local not specified - PPO | Attending: Emergency Medicine | Admitting: Emergency Medicine

## 2019-08-26 ENCOUNTER — Encounter (HOSPITAL_BASED_OUTPATIENT_CLINIC_OR_DEPARTMENT_OTHER): Payer: Self-pay | Admitting: *Deleted

## 2019-08-26 ENCOUNTER — Other Ambulatory Visit: Payer: Self-pay

## 2019-08-26 DIAGNOSIS — I10 Essential (primary) hypertension: Secondary | ICD-10-CM | POA: Diagnosis not present

## 2019-08-26 DIAGNOSIS — F17200 Nicotine dependence, unspecified, uncomplicated: Secondary | ICD-10-CM | POA: Diagnosis not present

## 2019-08-26 DIAGNOSIS — Z79899 Other long term (current) drug therapy: Secondary | ICD-10-CM | POA: Insufficient documentation

## 2019-08-26 DIAGNOSIS — L089 Local infection of the skin and subcutaneous tissue, unspecified: Secondary | ICD-10-CM

## 2019-08-26 DIAGNOSIS — L03116 Cellulitis of left lower limb: Secondary | ICD-10-CM | POA: Diagnosis not present

## 2019-08-26 DIAGNOSIS — M79672 Pain in left foot: Secondary | ICD-10-CM | POA: Diagnosis present

## 2019-08-26 LAB — CBC WITH DIFFERENTIAL/PLATELET
Abs Immature Granulocytes: 0.02 10*3/uL (ref 0.00–0.07)
Basophils Absolute: 0 10*3/uL (ref 0.0–0.1)
Basophils Relative: 0 %
Eosinophils Absolute: 0.2 10*3/uL (ref 0.0–0.5)
Eosinophils Relative: 2 %
HCT: 37.7 % — ABNORMAL LOW (ref 39.0–52.0)
Hemoglobin: 12 g/dL — ABNORMAL LOW (ref 13.0–17.0)
Immature Granulocytes: 0 %
Lymphocytes Relative: 21 %
Lymphs Abs: 1.8 10*3/uL (ref 0.7–4.0)
MCH: 32.1 pg (ref 26.0–34.0)
MCHC: 31.8 g/dL (ref 30.0–36.0)
MCV: 100.8 fL — ABNORMAL HIGH (ref 80.0–100.0)
Monocytes Absolute: 1 10*3/uL (ref 0.1–1.0)
Monocytes Relative: 11 %
Neutro Abs: 5.9 10*3/uL (ref 1.7–7.7)
Neutrophils Relative %: 66 %
Platelets: 245 10*3/uL (ref 150–400)
RBC: 3.74 MIL/uL — ABNORMAL LOW (ref 4.22–5.81)
RDW: 13.4 % (ref 11.5–15.5)
WBC: 8.9 10*3/uL (ref 4.0–10.5)
nRBC: 0 % (ref 0.0–0.2)

## 2019-08-26 LAB — BASIC METABOLIC PANEL
Anion gap: 10 (ref 5–15)
BUN: 14 mg/dL (ref 8–23)
CO2: 24 mmol/L (ref 22–32)
Calcium: 9.1 mg/dL (ref 8.9–10.3)
Chloride: 101 mmol/L (ref 98–111)
Creatinine, Ser: 0.96 mg/dL (ref 0.61–1.24)
GFR calc Af Amer: >60 mL/min (ref >60)
GFR calc non Af Amer: >60 mL/min (ref >60)
Glucose, Bld: 77 mg/dL (ref 70–99)
Potassium: 3.9 mmol/L (ref 3.5–5.1)
Sodium: 135 mmol/L (ref 135–145)

## 2019-08-26 MED ORDER — SODIUM CHLORIDE 0.9 % IV SOLN
INTRAVENOUS | Status: DC
  Administered 2019-08-26: 21:00:00 via INTRAVENOUS

## 2019-08-26 MED ORDER — HYDROCODONE-ACETAMINOPHEN 5-325 MG PO TABS
1.0000 | ORAL_TABLET | Freq: Once | ORAL | Status: AC
  Administered 2019-08-26: 1 via ORAL
  Filled 2019-08-26: qty 1

## 2019-08-26 MED ORDER — DOXYCYCLINE HYCLATE 100 MG PO CAPS: 100.0000 mg | ORAL_CAPSULE | Freq: Two times a day (BID) | ORAL | 0 refills | Status: AC

## 2019-08-26 MED ORDER — HYDROCODONE-ACETAMINOPHEN 5-325 MG PO TABS: 1.0000 | ORAL_TABLET | Freq: Four times a day (QID) | ORAL | 0 refills | Status: AC | PRN

## 2019-08-26 MED ORDER — CEFAZOLIN SODIUM-DEXTROSE 1-4 GM/50ML-% IV SOLN
1.0000 g | Freq: Once | INTRAVENOUS | Status: AC
  Administered 2019-08-26: 1 g via INTRAVENOUS
  Filled 2019-08-26: qty 50

## 2019-08-26 NOTE — Discharge Instructions (Addendum)
Take the antibiotic as directed.  Elevate the left foot is much as possible.  Try to stay off of it is much as possible.  Soak it in warm water for 20 minutes twice a day.  Work note provided to be out of work for a week.  If the redness starts to spread or things get worse need to get re-seen may require admission.  Take the pain medication as directed.

## 2019-08-26 NOTE — ED Provider Notes (Signed)
[name] EMERGENCY DEPARTMENT Provider Note   CSN: [id number] Arrival date & time: 08/26/19  1819     History   Chief Complaint Chief Complaint  Patient presents with  . Foot Pain    HPI Kyle Chaney is a 63 y.o. male.     Patient with swelling and infection to his left foot.  Patient developed some blisters on his toes from wearing shoes that were too tight.  Has a healing blister on the right second toe developed a blister on the left third toe.  Which is actually healing but now has all this redness and swelling to the foot itself mostly on top of the foot.  Patient has a history of hypertension.  Is on lisinopril for that.  Patient has no known history of diabetes.     Past Medical History:  Diagnosis Date  . Detached retina   . Hypertension     There are no active problems to display for this patient.   Past Surgical History:  Procedure Laterality Date  . KNEE SURGERY          Home Medications    Prior to Admission medications   Medication Sig Start Date End Date Taking? Authorizing Provider  lisinopril (PRINIVIL,ZESTRIL) 20 MG tablet Take 20 mg by mouth daily.     Yes [provider]  amoxicillin-clavulanate (AUGMENTIN) 875-125 MG tablet Take 1 tablet by mouth every 12 (twelve) hours. 03/19/18   Law, Bea Graff, PA-C  cetirizine (ZYRTEC ALLERGY) 10 MG tablet Take 1 tablet (10 mg total) by mouth daily. 03/19/18   Law, Bea Graff, PA-C  doxycycline (VIBRAMYCIN) 100 MG capsule Take 1 capsule (100 mg total) by mouth 2 (two) times daily. 08/26/19   Fredia Sorrow, MD  famotidine (PEPCID) 20 MG tablet Take 1 tablet (20 mg total) by mouth 2 (two) times daily. 07/11/18   Khatri, Hina, PA-C  fluticasone (FLONASE) 50 MCG/ACT nasal spray Place 2 sprays into both nostrils daily. 03/19/18   Law, Bea Graff, PA-C  HYDROcodone-acetaminophen (NORCO/VICODIN) 5-325 MG tablet Take 1 tablet by mouth every 6 (six) hours as needed for moderate pain. 08/26/19    Fredia Sorrow, MD  ibuprofen (ADVIL,MOTRIN) 800 MG tablet Take 1 tablet (800 mg total) by mouth 3 (three) times daily with meals. 09/01/17   Caccavale, Sophia, PA-C  pantoprazole (PROTONIX) 20 MG tablet Take 2 tablets (40 mg total) by mouth daily. 11/19/11 11/18/12  Lacretia Leigh, MD    Family History No family history on file.  Social History Social History   Tobacco Use  . Smoking status: Current Every Day Smoker  . Smokeless tobacco: Never Used  Substance Use Topics  . Alcohol use: Not Currently    Comment: occ  . Drug use: No     Allergies   Patient has no known allergies.   Review of Systems Review of Systems  Constitutional: Negative for chills and fever.  HENT: Negative for congestion, rhinorrhea and sore throat.   Eyes: Negative for visual disturbance.  Respiratory: Negative for cough and shortness of breath.   Cardiovascular: Negative for chest pain and leg swelling.  Gastrointestinal: Negative for abdominal pain, diarrhea, nausea and vomiting.  Genitourinary: Negative for dysuria.  Musculoskeletal: Negative for back pain and neck pain.  Skin: Positive for wound. Negative for rash.  Neurological: Negative for dizziness, light-headedness and headaches.  Hematological: Does not bruise/bleed easily.  Psychiatric/Behavioral: Negative for confusion.     Physical Exam Updated Vital Signs BP (!) 152/103  Pulse 95   Temp 98.8 F (37.1 C) (Oral)   Resp 18   Ht 1.829 m (6')   Wt 74.8 kg   SpO2 100%   BMI 22.38 kg/m   Physical Exam Vitals signs and nursing note reviewed.  Constitutional:      Appearance: Normal appearance. He is well-developed.  HENT:     Head: Normocephalic and atraumatic.  Eyes:     Conjunctiva/sclera: Conjunctivae normal.     Pupils: Pupils are equal, round, and reactive to light.  Neck:     Musculoskeletal: Normal range of motion and neck supple.  Cardiovascular:     Rate and Rhythm: Normal rate and regular rhythm.     Heart  sounds: No murmur.  Pulmonary:     Effort: Pulmonary effort is normal. No respiratory distress.     Breath sounds: Normal breath sounds.  Abdominal:     Palpations: Abdomen is soft.     Tenderness: There is no abdominal tenderness.  Musculoskeletal:        General: Swelling and tenderness present.     Comments: Right foot second toe with a healing wound blister.  Left foot third toe with a healing wound or blister.  But then at the base of the toe and into the forefoot area there is significant erythema and swelling.  There is area of a blister with some excoriation of skin at the distal part of the forefoot.  There is no erythema spreading into the leg.  Good cap refill.  No crepitance.  Skin:    General: Skin is warm and dry.     Capillary Refill: Capillary refill takes less than 2 seconds.  Neurological:     General: No focal deficit present.     Mental Status: He is alert and oriented to person, place, and time.      ED Treatments / Results  Labs (all labs ordered are listed, but only abnormal results are displayed) Labs Reviewed  CBC WITH DIFFERENTIAL/PLATELET - Abnormal; Notable for the following components:      Result Value   RBC 3.74 (*)    Hemoglobin 12.0 (*)    HCT 37.7 (*)    MCV 100.8 (*)    All other components within normal limits  BASIC METABOLIC PANEL    EKG None  Radiology Dg Foot Complete Left  Result Date: 08/26/2019 CLINICAL DATA:  63 year old male with left foot infection. EXAM: LEFT FOOT - COMPLETE 3+ VIEW COMPARISON:  None. FINDINGS: There is no acute fracture or dislocation. Bones are well mineralized. No arthritic changes. There is mild hallux valgus. No bone erosion or periosteal elevation. There is mild soft tissue thickening of the dorsum of the foot. No radiopaque foreign object or soft tissue gas. Vascular calcifications noted. IMPRESSION: 1. No acute fracture or dislocation. No evidence of acute osteomyelitis by radiograph. 2. Mild soft tissue  thickening of the dorsum of the foot. Electronically Signed   By: Elgie Collard M.D.   On: 08/26/2019 21:27    Procedures Procedures (including critical care time)  Medications Ordered in ED Medications  0.9 %  sodium chloride infusion ( Intravenous New Bag/Given 08/26/19 2113)  HYDROcodone-acetaminophen (NORCO/VICODIN) 5-325 MG per tablet 1 tablet (has no administration in time range)  ceFAZolin (ANCEF) IVPB 1 g/50 mL premix (1 g Intravenous New Bag/Given 08/26/19 2115)     Initial Impression / Assessment and Plan / ED Course  I have reviewed the triage vital signs and the nursing notes.  Pertinent  labs & imaging results that were available during my care of the patient were reviewed by me and considered in my medical decision making (see chart for details).        Patient with significant cellulitis to the left foot.  Has area with a little bit of skin discoloration on the dorsum of the foot.  Erythema does not spread above the ankle.  X-ray showed no bony abnormality.  There is no evidence of any deep abscess.  No gas in the soft tissues.  No leukocytosis on labs.  No fever.  Electrolytes without significant abnormality.  Blood pressure somewhat elevated but patient is having pain.  Patient has a history of hypertension.  And he is on lisinopril.  Patient received 1 g of Ancef here.  Patient can be discharged home elevate the foot as much as possible soak it in warm water twice a day.  Continue doxycycline at home.  Hydrocodone provided for pain.  Taken out of work for a week.  Patient to be off the foot is much as possible.  He will return for any new or worse symptoms.    Final Clinical Impressions(s) / ED Diagnoses   Final diagnoses:  Cellulitis of left lower extremity  Left foot infection    ED Discharge Orders         Ordered    doxycycline (VIBRAMYCIN) 100 MG capsule  2 times daily     08/26/19 2201    HYDROcodone-acetaminophen (NORCO/VICODIN) 5-325 MG tablet  Every  6 hours PRN     08/26/19 2201           Vanetta Mulders, MD 08/26/19 2207

## 2019-08-26 NOTE — ED Notes (Signed)
ED Provider at bedside. 

## 2019-08-26 NOTE — ED Triage Notes (Signed)
Blisters on his toes. States it happened after wearing insoles in his shoes.

## 2020-04-30 IMAGING — DX DG CHEST 2V
2 series · 2 of 2 positions shown · non-contrast
Comparison: December 03, 2017

CLINICAL DATA: Chest discomfort

EXAM:
CHEST - 2 VIEW

[chest pa]
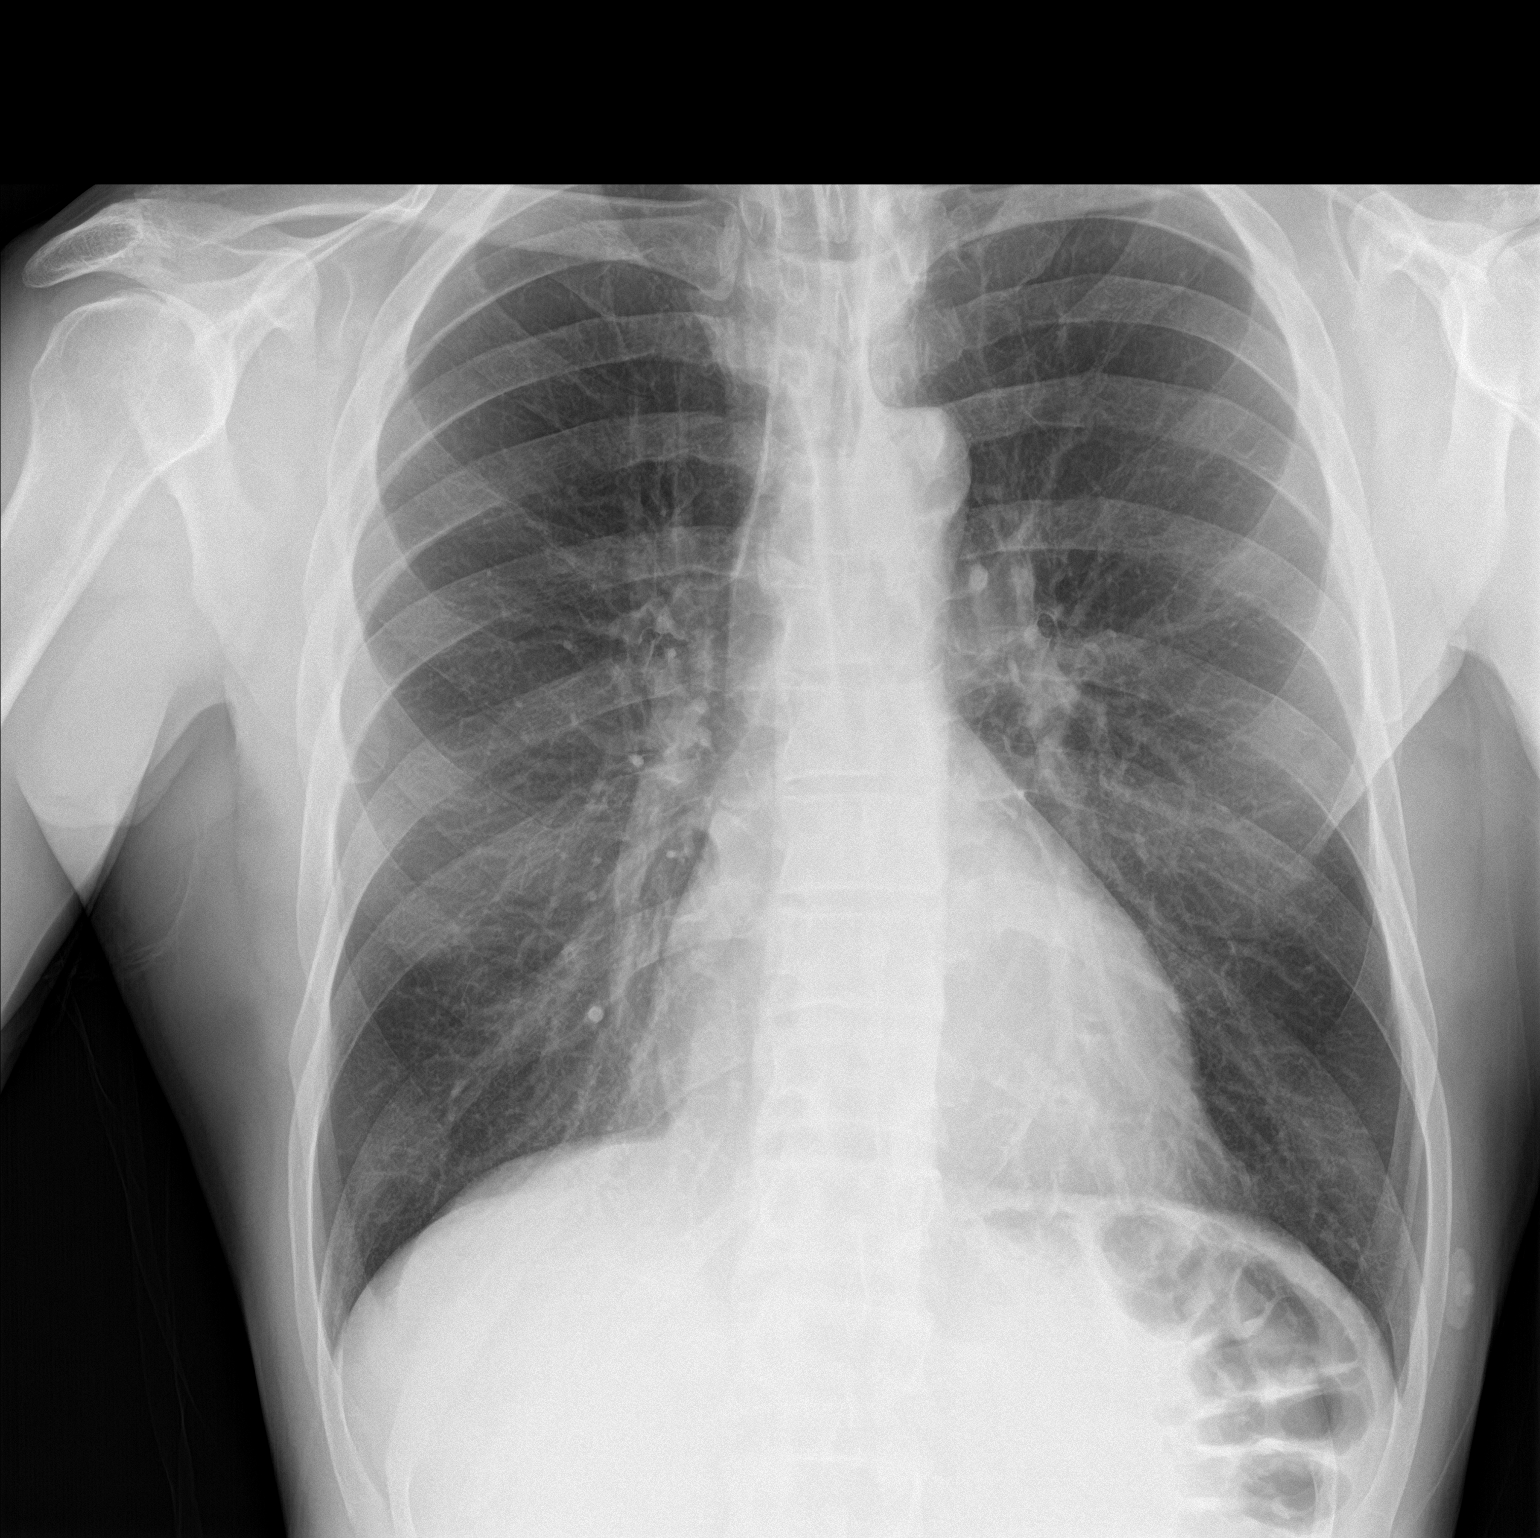

[chest lat]
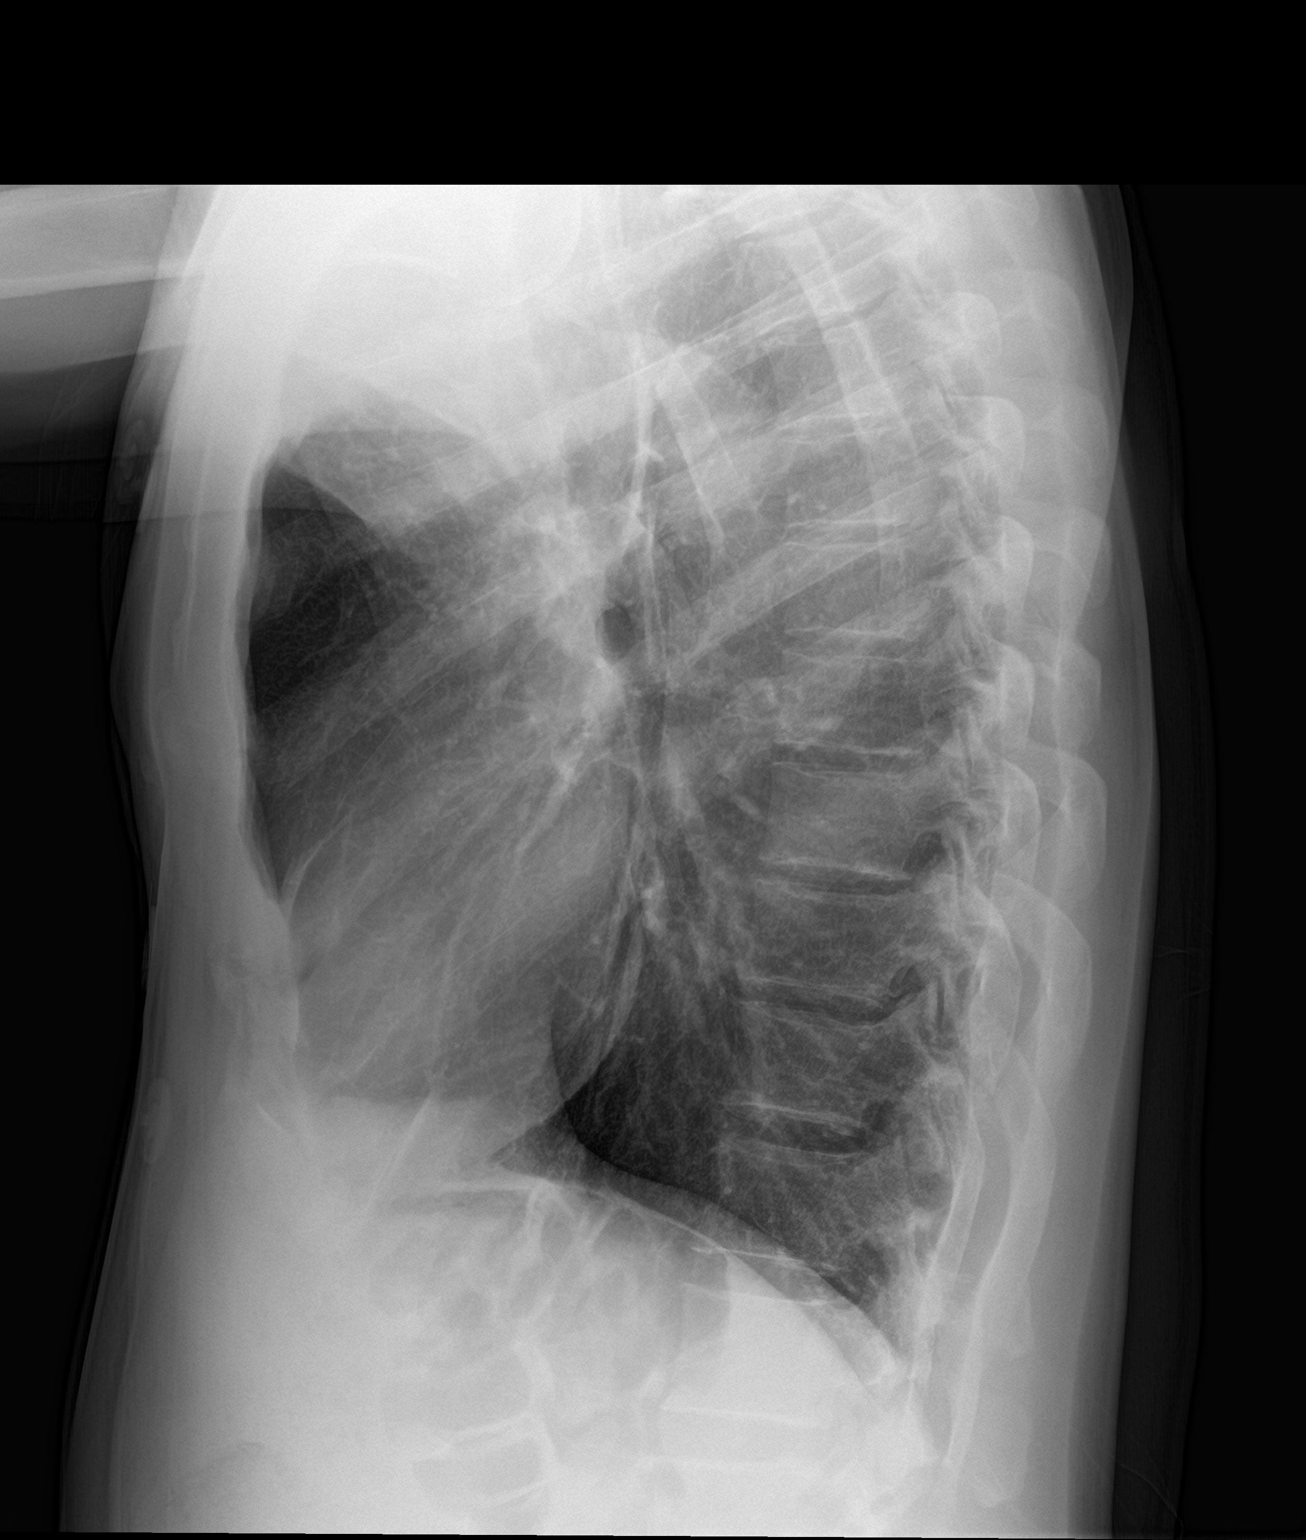

[2 of 2 positions shown; findings below may reference images not displayed]

FINDINGS: Lungs are mildly hyperexpanded. There is no edema or consolidation.
Heart size and pulmonary vascularity are normal. No adenopathy. No
bone lesions. No pneumothorax.
IMPRESSION: Lungs hyperexpanded without edema or consolidation. Stable cardiac
silhouette.

## 2021-06-15 IMAGING — CR DG FOOT COMPLETE 3+V*L*
3 series · 3 of 3 positions shown · non-contrast
Comparison: None.

CLINICAL DATA: 62-year-old male with left foot infection.

EXAM:
LEFT FOOT - COMPLETE 3+ VIEW

[t foot ap left]
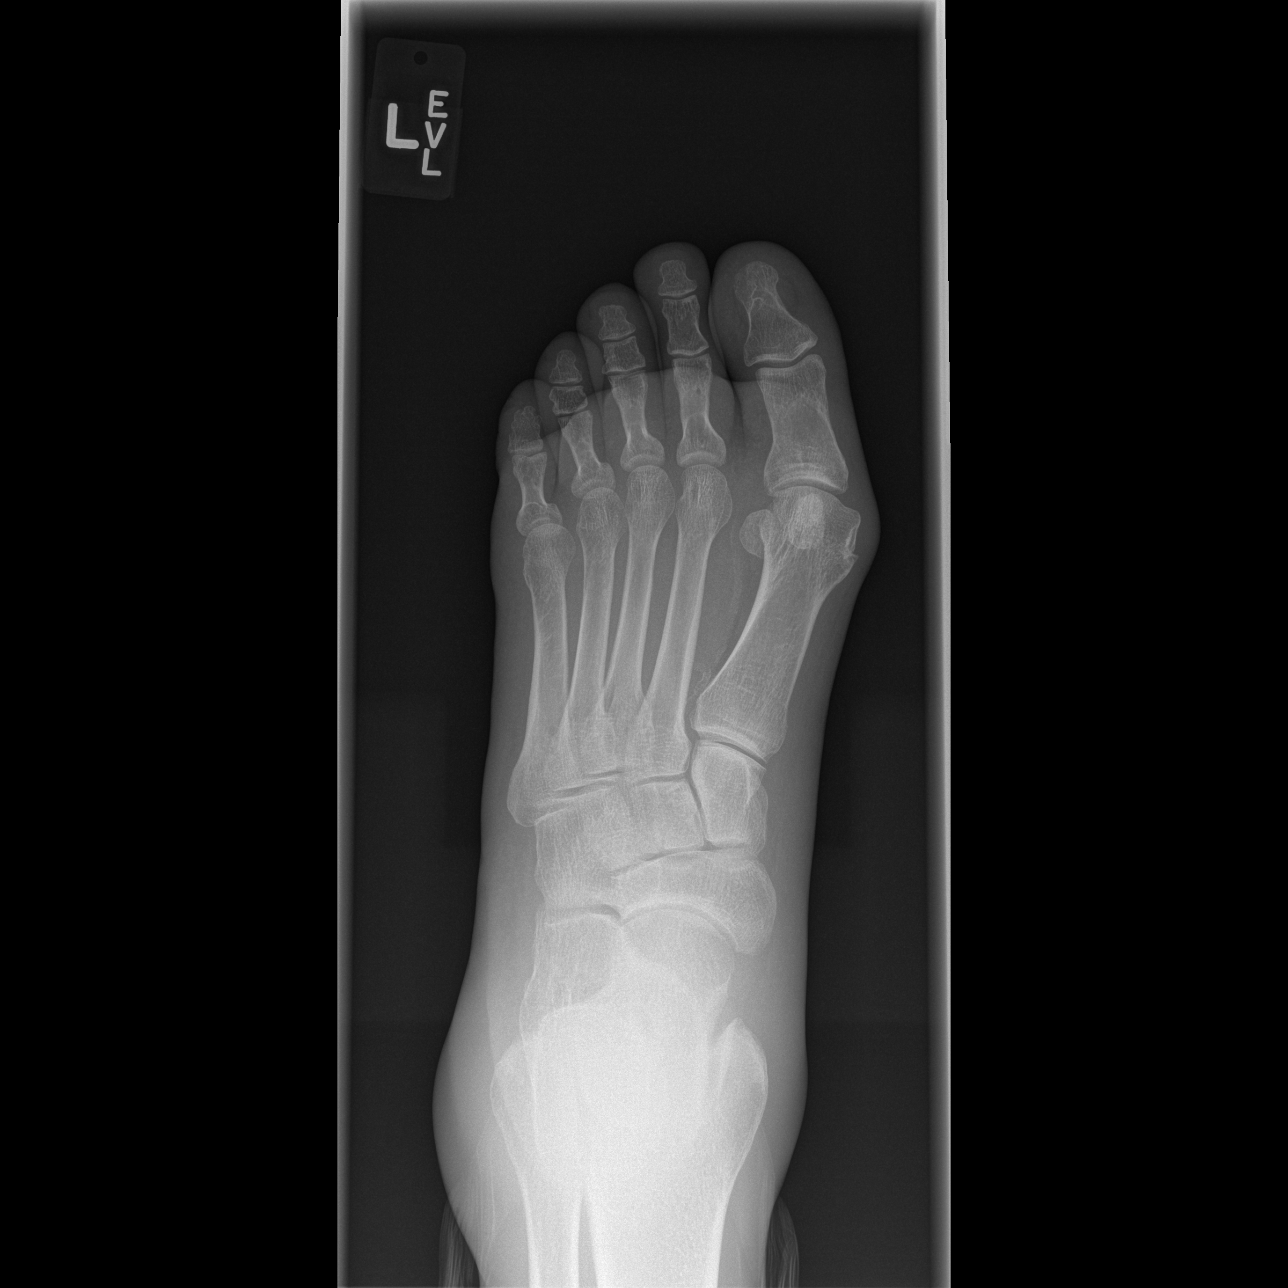

[t foot oblique left]
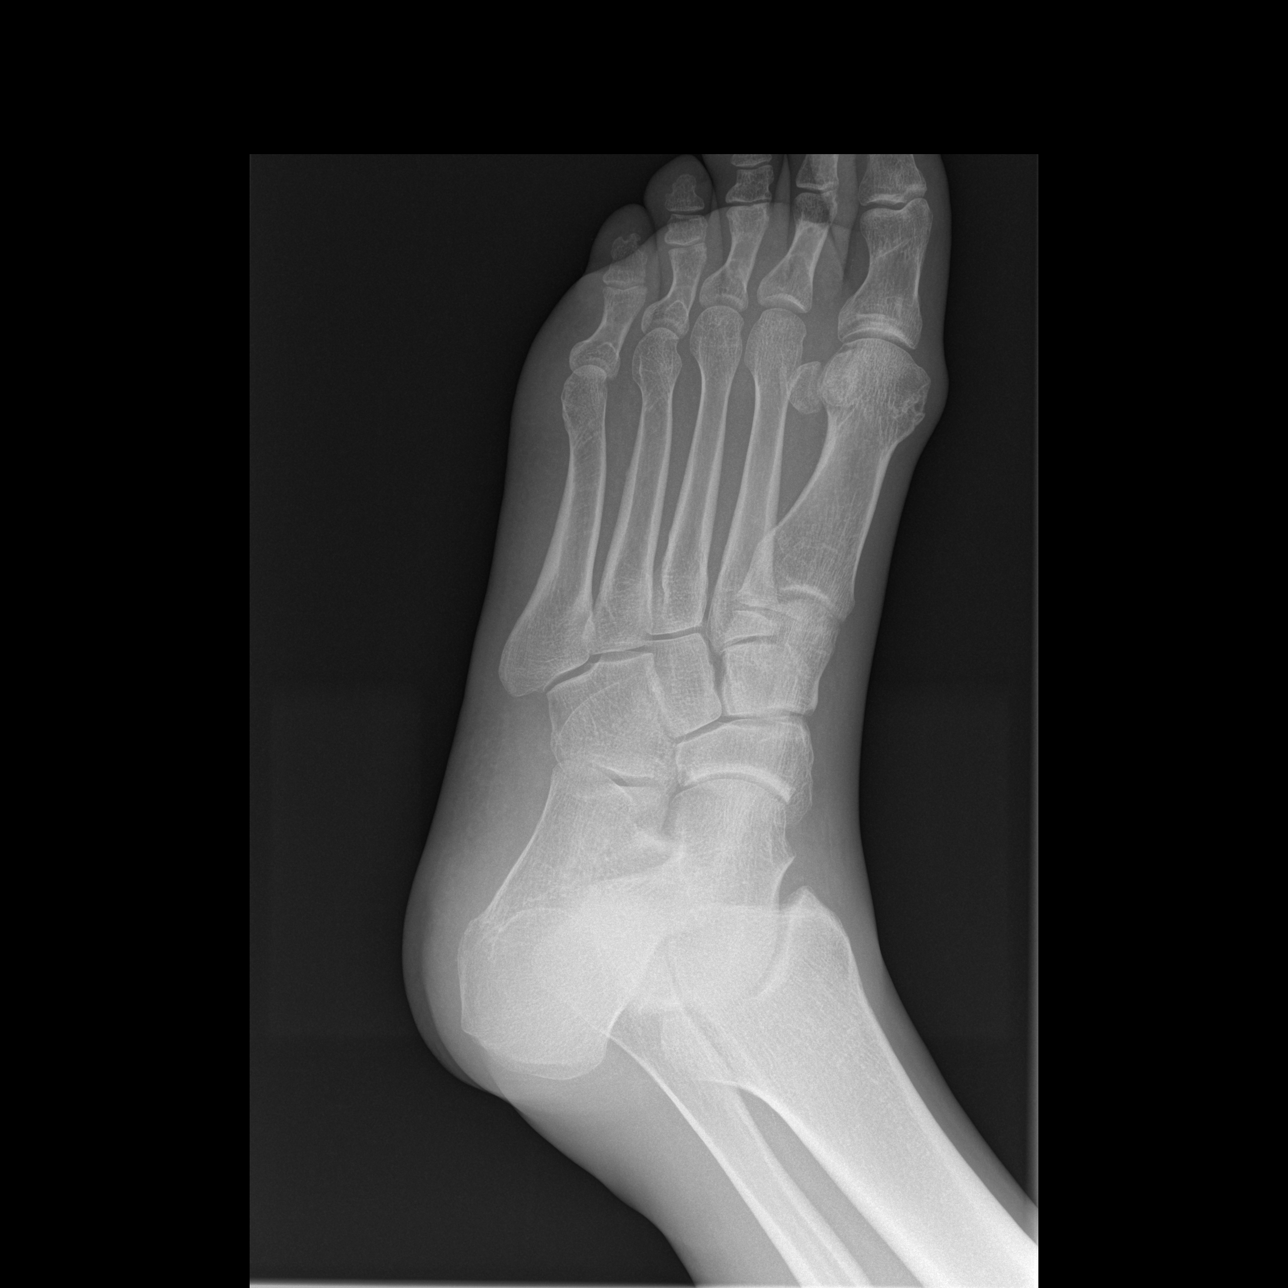

[t foot lat left]
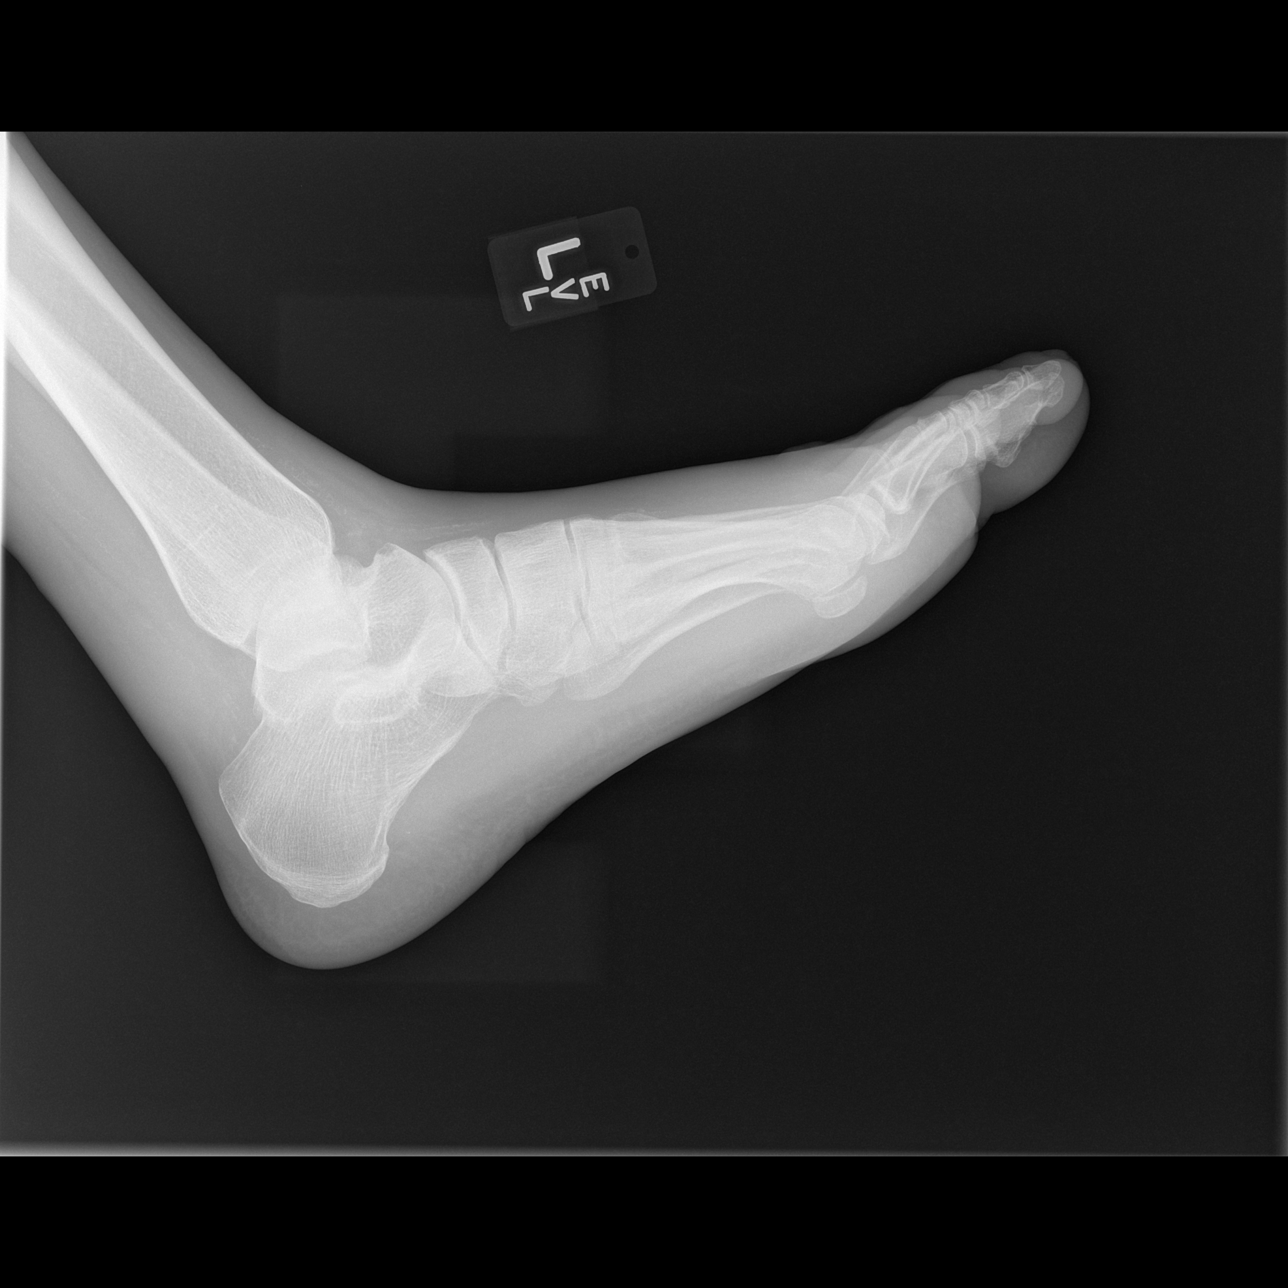

[3 of 3 positions shown; findings below may reference images not displayed]

FINDINGS: There is no acute fracture or dislocation. Bones are well
mineralized. No arthritic changes. There is mild hallux valgus. No
bone erosion or periosteal elevation. There is mild soft tissue
thickening of the dorsum of the foot. No radiopaque foreign object
or soft tissue gas. Vascular calcifications noted.
IMPRESSION: 1. No acute fracture or dislocation. No evidence of acute
osteomyelitis by radiograph.
2. Mild soft tissue thickening of the dorsum of the foot.

## 2022-06-09 ENCOUNTER — Other Ambulatory Visit (HOSPITAL_COMMUNITY): Payer: Self-pay | Admitting: Family Medicine

## 2022-06-09 DIAGNOSIS — R079 Chest pain, unspecified: Secondary | ICD-10-CM

## 2022-06-15 ENCOUNTER — Telehealth (HOSPITAL_COMMUNITY): Payer: Self-pay | Admitting: Emergency Medicine

## 2022-06-15 DIAGNOSIS — R079 Chest pain, unspecified: Secondary | ICD-10-CM

## 2022-06-15 MED ORDER — METOPROLOL TARTRATE 100 MG PO TABS: 100.0000 mg | ORAL_TABLET | Freq: Once | ORAL | 0 refills | Status: AC

## 2022-06-15 NOTE — Telephone Encounter (Signed)
Reaching out to patient to offer assistance regarding upcoming cardiac imaging study; pt verbalizes understanding of appt date/time, parking situation and where to check in, pre-test NPO status and medications ordered, and verified current allergies; name and call back number provided for further questions should they arise Rockwell Alexandria RN Navigator Cardiac Imaging [facility] [phone] office [phone] cell  Denies ED medications Denies iv issues 100mg  metoprolol sent to [facility] per pt preference Arrival 230 Emailed instructions per pt request

## 2022-06-19 ENCOUNTER — Ambulatory Visit (HOSPITAL_COMMUNITY)
Admission: RE | Admit: 2022-06-19 | Discharge: 2022-06-19 | Disposition: A | Discharge status: Home / Self Care | Payer: Federal, State, Local not specified - PPO | Source: Ambulatory Visit | Attending: Family Medicine | Admitting: Family Medicine

## 2022-06-19 DIAGNOSIS — R079 Chest pain, unspecified: Secondary | ICD-10-CM | POA: Insufficient documentation

## 2022-06-19 MED ORDER — NITROGLYCERIN 0.4 MG SL SUBL
0.8000 mg | SUBLINGUAL_TABLET | Freq: Once | SUBLINGUAL | Status: AC
  Administered 2022-06-19: 0.8 mg via SUBLINGUAL

## 2022-06-19 MED ORDER — NITROGLYCERIN 0.4 MG SL SUBL
SUBLINGUAL_TABLET | SUBLINGUAL | Status: AC
  Filled 2022-06-19: qty 2

## 2022-06-19 MED ORDER — IOHEXOL 350 MG/ML SOLN
100.0000 mL | Freq: Once | INTRAVENOUS | Status: AC | PRN
  Administered 2022-06-19: 100 mL via INTRAVENOUS

## 2023-10-07 ENCOUNTER — Emergency Department (HOSPITAL_BASED_OUTPATIENT_CLINIC_OR_DEPARTMENT_OTHER)
Admission: EM | Admit: 2023-10-07 | Discharge: 2023-10-07 | Disposition: A | Discharge status: Home / Self Care | Payer: Federal, State, Local not specified - PPO | Attending: Emergency Medicine | Admitting: Emergency Medicine

## 2023-10-07 ENCOUNTER — Encounter (HOSPITAL_BASED_OUTPATIENT_CLINIC_OR_DEPARTMENT_OTHER): Payer: Self-pay | Admitting: Emergency Medicine

## 2023-10-07 ENCOUNTER — Other Ambulatory Visit: Payer: Self-pay

## 2023-10-07 ENCOUNTER — Emergency Department (HOSPITAL_BASED_OUTPATIENT_CLINIC_OR_DEPARTMENT_OTHER): Payer: Federal, State, Local not specified - PPO

## 2023-10-07 ENCOUNTER — Telehealth (HOSPITAL_BASED_OUTPATIENT_CLINIC_OR_DEPARTMENT_OTHER): Payer: Self-pay | Admitting: Emergency Medicine

## 2023-10-07 DIAGNOSIS — R0989 Other specified symptoms and signs involving the circulatory and respiratory systems: Secondary | ICD-10-CM | POA: Diagnosis present

## 2023-10-07 DIAGNOSIS — I10 Essential (primary) hypertension: Secondary | ICD-10-CM | POA: Diagnosis not present

## 2023-10-07 DIAGNOSIS — Z79899 Other long term (current) drug therapy: Secondary | ICD-10-CM | POA: Diagnosis not present

## 2023-10-07 MED ORDER — GUAIFENESIN 100 MG/5ML PO LIQD: 100.0000 mg | ORAL | 0 refills | Status: AC | PRN

## 2023-10-07 MED ORDER — GUAIFENESIN 100 MG/5ML PO LIQD: 100.0000 mg | ORAL | 0 refills | Status: DC | PRN

## 2023-10-07 NOTE — Discharge Instructions (Signed)
You have been evaluated for your symptoms.  Fortunately chest x-ray today did not show any signs of pneumonia.  This may be early signs of a viral infection.  Follow-up with your doctor for further care.

## 2023-10-07 NOTE — ED Provider Notes (Signed)
[place] [facility] Provider Note   CSN: [id number] Arrival date & time: 10/07/23  1059     History  Chief Complaint  Patient presents with   Cough    Kyle Chaney is a 67 y.o. male.  The history is provided by the patient and medical records. No language interpreter was used.  Cough    67 year old male with hx of Hypertension presenting with complaints of cough and chest congestion.  Patient over the past 2 days he has had some chest congestion.  Symptoms mild to moderate in.  No associated fever no runny nose sneezing sore throat no shortness of breath no chest pain.  Will likely have a chest x-ray taking some medication without relief.  No history of PE or DVT.   Home Medications Prior to Admission medications   Medication Sig Start Date End Date Taking? Authorizing Provider  amoxicillin-clavulanate (AUGMENTIN) 875-125 MG tablet Take 1 tablet by mouth every 12 (twelve) hours. 03/19/18   Law, Waylan Boga, PA-C  cetirizine (ZYRTEC ALLERGY) 10 MG tablet Take 1 tablet (10 mg total) by mouth daily. 03/19/18   Law, Waylan Boga, PA-C  doxycycline (VIBRAMYCIN) 100 MG capsule Take 1 capsule (100 mg total) by mouth 2 (two) times daily. 08/26/19   Vanetta Mulders, MD  famotidine (PEPCID) 20 MG tablet Take 1 tablet (20 mg total) by mouth 2 (two) times daily. 07/11/18   Khatri, Hina, PA-C  fluticasone (FLONASE) 50 MCG/ACT nasal spray Place 2 sprays into both nostrils daily. 03/19/18   Law, Waylan Boga, PA-C  HYDROcodone-acetaminophen (NORCO/VICODIN) 5-325 MG tablet Take 1 tablet by mouth every 6 (six) hours as needed for moderate pain. 08/26/19   Vanetta Mulders, MD  ibuprofen (ADVIL,MOTRIN) 800 MG tablet Take 1 tablet (800 mg total) by mouth 3 (three) times daily with meals. 09/01/17   Caccavale, Sophia, PA-C  lisinopril (PRINIVIL,ZESTRIL) 20 MG tablet Take 20 mg by mouth daily.      [provider]  metoprolol tartrate (LOPRESSOR) 100 MG tablet Take  1 tablet (100 mg total) by mouth once for 1 dose. Please take one time dose 100mg  metoprolol tartrate 2 hr prior to cardiac CT for HR control IF HR >55bpm. 06/15/22 06/15/22  O'Neal, Ronnald Ramp, MD  pantoprazole (PROTONIX) 20 MG tablet Take 2 tablets (40 mg total) by mouth daily. 11/19/11 11/18/12  Lorre Nick, MD      Allergies    Patient has no known allergies.    Review of Systems   Review of Systems  Respiratory:  Positive for cough.   All other systems reviewed and are negative.   Physical Exam Updated Vital Signs BP 133/81 (BP Location: Left Arm)   Pulse 91   Temp 98.2 F (36.8 C) (Oral)   Resp 16   Ht 6' (1.829 m)   Wt 77.1 kg   SpO2 93%   BMI 23.06 kg/m  Physical Exam Vitals and nursing note reviewed.  Constitutional:      General: He is not in acute distress.    Appearance: He is well-developed.  HENT:     Head: Atraumatic.  Eyes:     Conjunctiva/sclera: Conjunctivae normal.  Cardiovascular:     Rate and Rhythm: Normal rate and regular rhythm.     Pulses: Normal pulses.     Heart sounds: Normal heart sounds.  Pulmonary:     Effort: Pulmonary effort is normal.     Breath sounds: Normal breath sounds. No wheezing, rhonchi or  rales.  Abdominal:     Palpations: Abdomen is soft.     Tenderness: There is no abdominal tenderness.  Musculoskeletal:     Cervical back: Normal range of motion and neck supple.     Right lower leg: No edema.     Left lower leg: No edema.  Skin:    Findings: No rash.  Neurological:     Mental Status: He is alert.     ED Results / Procedures / Treatments   Labs (all labs ordered are listed, but only abnormal results are displayed) Labs Reviewed - No data to display  EKG None  Radiology DG Chest 2 View  Result Date: 10/07/2023 CLINICAL DATA:  Cough EXAM: CHEST - 2 VIEW COMPARISON:  Chest x-ray dated July 11, 2018 FINDINGS: The heart size and mediastinal contours are within normal limits. Mild lower lobe atelectasis.  Both lungs are otherwise clear. The visualized skeletal structures are unremarkable. IMPRESSION: No active cardiopulmonary disease. Electronically Signed   By: Allegra Lai M.D.   On: 10/07/2023 12:22    Procedures Procedures    Medications Ordered in ED Medications - No data to display  ED Course/ Medical Decision Making/ A&P                                 Medical Decision Making Amount and/or Complexity of Data Reviewed Radiology: ordered.   BP 133/81 (BP Location: Left Arm)   Pulse 91   Temp 98.2 F (36.8 C) (Oral)   Resp 16   Ht 6' (1.829 m)   Wt 77.1 kg   SpO2 93%   BMI 23.06 kg/m   3:76 PM  67 year old male with hx of Hypertension presenting with complaints of cough and chest congestion.  Patient over the past 2 days he has had some chest congestion.  Symptoms mild to moderate in.  No associated fever no runny nose sneezing sore throat no shortness of breath no chest pain.  Will likely have a chest x-ray taking some medication without relief.  No history of PE or DVT.  Exam overall reassuring lungs clear to auscultation abdomen is soft nontender no peripheral edema and nose and throat unremarkable.  Vital sign overall reassuring no fever no hypoxia.  DDx: COVID, flu, RSV, pneumonia, CHF, pleural effusion  Chest x-ray obtained independently viewed and treatment by me without any acute finding, agree with giving patient without any hypoxia and no other acute symptoms, will prescribe supportive management and outpatient follow-up.  Return precaution given.  No suspicion for PE or DVT.         Final Clinical Impression(s) / ED Diagnoses Final diagnoses:  Chest congestion    Rx / DC Orders ED Discharge Orders          Ordered    guaiFENesin (ROBITUSSIN) 100 MG/5ML liquid  Every 4 hours PRN        10/07/23 1323              Fayrene Helper, PA-C 10/07/23 1325    Trifan, Kermit Balo, MD 10/07/23 1437

## 2023-10-07 NOTE — ED Notes (Signed)

## 2023-10-07 NOTE — ED Triage Notes (Signed)
Chest congestion with cough for 2 days.  Pt requesting a chest xray.  No fever.  Occasional productive cough.  No sob.
